# Patient Record
Sex: Male | Born: 1975 | Race: White | Hispanic: No | Marital: Married | Smoking: Current every day smoker
Health system: Southern US, Community
[De-identification: ages and names within clinical notes are randomized; demographics above are authoritative.]

## PROBLEM LIST (undated history)

## (undated) DIAGNOSIS — T7840XA Allergy, unspecified, initial encounter: Secondary | ICD-10-CM

## (undated) DIAGNOSIS — K219 Gastro-esophageal reflux disease without esophagitis: Secondary | ICD-10-CM

## (undated) DIAGNOSIS — N200 Calculus of kidney: Secondary | ICD-10-CM

## (undated) DIAGNOSIS — R55 Syncope and collapse: Secondary | ICD-10-CM

## (undated) DIAGNOSIS — M5136 Other intervertebral disc degeneration, lumbar region: Secondary | ICD-10-CM

## (undated) DIAGNOSIS — M5126 Other intervertebral disc displacement, lumbar region: Secondary | ICD-10-CM

## (undated) DIAGNOSIS — R51 Headache: Secondary | ICD-10-CM

## (undated) DIAGNOSIS — J45909 Unspecified asthma, uncomplicated: Secondary | ICD-10-CM

## (undated) DIAGNOSIS — R569 Unspecified convulsions: Secondary | ICD-10-CM

## (undated) DIAGNOSIS — F419 Anxiety disorder, unspecified: Secondary | ICD-10-CM

## (undated) DIAGNOSIS — M51369 Other intervertebral disc degeneration, lumbar region without mention of lumbar back pain or lower extremity pain: Secondary | ICD-10-CM

## (undated) HISTORY — DX: Syncope and collapse: R55

## (undated) HISTORY — DX: Allergy, unspecified, initial encounter: T78.40XA

## (undated) HISTORY — DX: Headache: R51

## (undated) HISTORY — PX: TONSILLECTOMY: SUR1361

## (undated) HISTORY — PX: CHOLECYSTECTOMY: SHX55

## (undated) HISTORY — PX: APPENDECTOMY: SHX54

## (undated) HISTORY — PX: CERVICAL DISC ARTHROPLASTY: SHX587

## (undated) HISTORY — PX: HERNIA REPAIR: SHX51

## (undated) HISTORY — PX: CYSTOSCOPY W/ URETERAL STENT PLACEMENT: SHX1429

## (undated) HISTORY — DX: Unspecified convulsions: R56.9

## (undated) HISTORY — PX: EXTRACORPOREAL SHOCK WAVE LITHOTRIPSY: SHX1557

---

## 2004-03-05 ENCOUNTER — Emergency Department (HOSPITAL_COMMUNITY): Admission: EM | Admit: 2004-03-05 | Discharge: 2004-03-05 | Payer: Self-pay | Admitting: Emergency Medicine

## 2005-09-03 ENCOUNTER — Emergency Department (HOSPITAL_COMMUNITY): Admission: EM | Admit: 2005-09-03 | Discharge: 2005-09-03 | Payer: Self-pay | Admitting: Emergency Medicine

## 2005-11-11 ENCOUNTER — Emergency Department (HOSPITAL_COMMUNITY): Admission: EM | Admit: 2005-11-11 | Discharge: 2005-11-11 | Payer: Self-pay | Admitting: Emergency Medicine

## 2005-12-15 ENCOUNTER — Emergency Department (HOSPITAL_COMMUNITY): Admission: EM | Admit: 2005-12-15 | Discharge: 2005-12-15 | Payer: Self-pay | Admitting: Emergency Medicine

## 2006-01-07 ENCOUNTER — Emergency Department (HOSPITAL_COMMUNITY): Admission: EM | Admit: 2006-01-07 | Discharge: 2006-01-07 | Payer: Self-pay | Admitting: Emergency Medicine

## 2006-02-16 ENCOUNTER — Emergency Department (HOSPITAL_COMMUNITY): Admission: EM | Admit: 2006-02-16 | Discharge: 2006-02-16 | Payer: Self-pay | Admitting: *Deleted

## 2006-04-01 ENCOUNTER — Emergency Department (HOSPITAL_COMMUNITY): Admission: EM | Admit: 2006-04-01 | Discharge: 2006-04-01 | Payer: Self-pay | Admitting: Emergency Medicine

## 2006-04-13 ENCOUNTER — Emergency Department (HOSPITAL_COMMUNITY): Admission: EM | Admit: 2006-04-13 | Discharge: 2006-04-13 | Payer: Self-pay | Admitting: Emergency Medicine

## 2006-05-15 ENCOUNTER — Emergency Department (HOSPITAL_COMMUNITY): Admission: EM | Admit: 2006-05-15 | Discharge: 2006-05-15 | Payer: Self-pay | Admitting: Emergency Medicine

## 2006-05-26 ENCOUNTER — Emergency Department (HOSPITAL_COMMUNITY): Admission: EM | Admit: 2006-05-26 | Discharge: 2006-05-26 | Payer: Self-pay | Admitting: Emergency Medicine

## 2006-08-05 ENCOUNTER — Emergency Department (HOSPITAL_COMMUNITY): Admission: EM | Admit: 2006-08-05 | Discharge: 2006-08-05 | Payer: Self-pay | Admitting: Emergency Medicine

## 2006-08-17 ENCOUNTER — Emergency Department (HOSPITAL_COMMUNITY): Admission: EM | Admit: 2006-08-17 | Discharge: 2006-08-17 | Payer: Self-pay | Admitting: Emergency Medicine

## 2006-09-15 ENCOUNTER — Emergency Department (HOSPITAL_COMMUNITY): Admission: EM | Admit: 2006-09-15 | Discharge: 2006-09-15 | Payer: Self-pay | Admitting: Emergency Medicine

## 2006-11-30 ENCOUNTER — Emergency Department (HOSPITAL_COMMUNITY): Admission: EM | Admit: 2006-11-30 | Discharge: 2006-11-30 | Payer: Self-pay | Admitting: Emergency Medicine

## 2006-12-10 ENCOUNTER — Emergency Department (HOSPITAL_COMMUNITY): Admission: EM | Admit: 2006-12-10 | Discharge: 2006-12-10 | Payer: Self-pay | Admitting: Emergency Medicine

## 2007-03-02 ENCOUNTER — Emergency Department (HOSPITAL_COMMUNITY): Admission: EM | Admit: 2007-03-02 | Discharge: 2007-03-02 | Payer: Self-pay | Admitting: Emergency Medicine

## 2007-05-17 ENCOUNTER — Emergency Department (HOSPITAL_COMMUNITY): Admission: EM | Admit: 2007-05-17 | Discharge: 2007-05-17 | Payer: Self-pay | Admitting: Emergency Medicine

## 2007-08-11 ENCOUNTER — Emergency Department (HOSPITAL_COMMUNITY): Admission: EM | Admit: 2007-08-11 | Discharge: 2007-08-11 | Payer: Self-pay | Admitting: Emergency Medicine

## 2007-10-16 ENCOUNTER — Emergency Department (HOSPITAL_COMMUNITY): Admission: EM | Admit: 2007-10-16 | Discharge: 2007-10-16 | Payer: Self-pay | Admitting: Emergency Medicine

## 2007-11-22 ENCOUNTER — Emergency Department (HOSPITAL_COMMUNITY): Admission: EM | Admit: 2007-11-22 | Discharge: 2007-11-22 | Payer: Self-pay | Admitting: Neurosurgery

## 2008-02-28 ENCOUNTER — Ambulatory Visit (HOSPITAL_COMMUNITY): Admission: RE | Admit: 2008-02-28 | Discharge: 2008-02-28 | Payer: Self-pay | Admitting: Family Medicine

## 2008-03-23 ENCOUNTER — Emergency Department (HOSPITAL_COMMUNITY): Admission: EM | Admit: 2008-03-23 | Discharge: 2008-03-23 | Payer: Self-pay | Admitting: Emergency Medicine

## 2008-08-17 ENCOUNTER — Emergency Department (HOSPITAL_COMMUNITY): Admission: EM | Admit: 2008-08-17 | Discharge: 2008-08-17 | Payer: Self-pay | Admitting: Emergency Medicine

## 2008-09-02 ENCOUNTER — Emergency Department (HOSPITAL_COMMUNITY): Admission: EM | Admit: 2008-09-02 | Discharge: 2008-09-03 | Payer: Self-pay | Admitting: Emergency Medicine

## 2008-09-04 ENCOUNTER — Observation Stay (HOSPITAL_COMMUNITY): Admission: EM | Admit: 2008-09-04 | Discharge: 2008-09-04 | Payer: Self-pay | Admitting: Emergency Medicine

## 2008-09-09 ENCOUNTER — Inpatient Hospital Stay (HOSPITAL_COMMUNITY): Admission: EM | Admit: 2008-09-09 | Discharge: 2008-09-12 | Payer: Self-pay | Admitting: Emergency Medicine

## 2009-01-17 ENCOUNTER — Emergency Department (HOSPITAL_COMMUNITY): Admission: EM | Admit: 2009-01-17 | Discharge: 2009-01-17 | Payer: Self-pay | Admitting: Emergency Medicine

## 2009-01-17 ENCOUNTER — Emergency Department (HOSPITAL_COMMUNITY): Admission: EM | Admit: 2009-01-17 | Discharge: 2009-01-17 | Payer: Self-pay | Admitting: Family Medicine

## 2009-07-18 ENCOUNTER — Emergency Department (HOSPITAL_COMMUNITY): Admission: EM | Admit: 2009-07-18 | Discharge: 2009-07-18 | Payer: Self-pay | Admitting: Family Medicine

## 2009-10-16 ENCOUNTER — Emergency Department (HOSPITAL_COMMUNITY): Admission: EM | Admit: 2009-10-16 | Discharge: 2009-10-16 | Payer: Self-pay | Admitting: Family Medicine

## 2010-07-08 ENCOUNTER — Encounter: Payer: Self-pay | Admitting: Family Medicine

## 2010-08-03 ENCOUNTER — Inpatient Hospital Stay (INDEPENDENT_AMBULATORY_CARE_PROVIDER_SITE_OTHER)
Admission: RE | Admit: 2010-08-03 | Discharge: 2010-08-03 | Disposition: A | Discharge status: Home / Self Care | Payer: Self-pay | Source: Ambulatory Visit | Attending: Family Medicine | Admitting: Family Medicine

## 2010-08-03 DIAGNOSIS — J45909 Unspecified asthma, uncomplicated: Secondary | ICD-10-CM

## 2010-08-03 DIAGNOSIS — J019 Acute sinusitis, unspecified: Secondary | ICD-10-CM

## 2010-08-26 ENCOUNTER — Inpatient Hospital Stay (INDEPENDENT_AMBULATORY_CARE_PROVIDER_SITE_OTHER)
Admission: RE | Admit: 2010-08-26 | Discharge: 2010-08-26 | Disposition: A | Discharge status: Home / Self Care | Payer: Self-pay | Source: Ambulatory Visit

## 2010-08-26 DIAGNOSIS — B9789 Other viral agents as the cause of diseases classified elsewhere: Secondary | ICD-10-CM

## 2010-09-16 ENCOUNTER — Emergency Department (HOSPITAL_COMMUNITY): Payer: Self-pay

## 2010-09-16 ENCOUNTER — Emergency Department (HOSPITAL_COMMUNITY)
Admission: EM | Admit: 2010-09-16 | Discharge: 2010-09-16 | Disposition: A | Discharge status: Home / Self Care | Payer: Self-pay | Attending: Emergency Medicine | Admitting: Emergency Medicine

## 2010-09-16 DIAGNOSIS — J45909 Unspecified asthma, uncomplicated: Secondary | ICD-10-CM | POA: Insufficient documentation

## 2010-09-16 DIAGNOSIS — M79609 Pain in unspecified limb: Secondary | ICD-10-CM | POA: Insufficient documentation

## 2010-09-16 DIAGNOSIS — R1032 Left lower quadrant pain: Secondary | ICD-10-CM | POA: Insufficient documentation

## 2010-09-16 DIAGNOSIS — R209 Unspecified disturbances of skin sensation: Secondary | ICD-10-CM | POA: Insufficient documentation

## 2010-09-16 DIAGNOSIS — Z9889 Other specified postprocedural states: Secondary | ICD-10-CM | POA: Insufficient documentation

## 2010-09-16 DIAGNOSIS — R110 Nausea: Secondary | ICD-10-CM | POA: Insufficient documentation

## 2010-09-16 DIAGNOSIS — R10814 Left lower quadrant abdominal tenderness: Secondary | ICD-10-CM | POA: Insufficient documentation

## 2010-09-16 LAB — URINALYSIS, ROUTINE W REFLEX MICROSCOPIC
Bilirubin Urine: NEGATIVE
Glucose, UA: NEGATIVE mg/dL
Hgb urine dipstick: NEGATIVE
Nitrite: NEGATIVE
Specific Gravity, Urine: 1.029 (ref 1.005–1.030)
Urobilinogen, UA: 0.2 mg/dL (ref 0.0–1.0)

## 2010-09-16 LAB — DIFFERENTIAL
Basophils Absolute: 0 10*3/uL (ref 0.0–0.1)
Basophils Relative: 0 % (ref 0–1)
Eosinophils Absolute: 0.2 10*3/uL (ref 0.0–0.7)
Monocytes Absolute: 0.6 10*3/uL (ref 0.1–1.0)
Neutro Abs: 5.2 10*3/uL (ref 1.7–7.7)
Neutrophils Relative %: 50 % (ref 43–77)

## 2010-09-16 LAB — BASIC METABOLIC PANEL
CO2: 25 mEq/L (ref 19–32)
Calcium: 9.1 mg/dL (ref 8.4–10.5)
Creatinine, Ser: 1.02 mg/dL (ref 0.4–1.5)
GFR calc Af Amer: >60 mL/min (ref >60)
GFR calc non Af Amer: >60 mL/min (ref >60)
Sodium: 140 mEq/L (ref 135–145)

## 2010-09-16 LAB — CBC
Hemoglobin: 15.9 g/dL (ref 13.0–17.0)
MCHC: 34.5 g/dL (ref 30.0–36.0)
Platelets: 284 10*3/uL (ref 150–400)

## 2010-09-16 MED ORDER — IOHEXOL 300 MG/ML  SOLN
100.0000 mL | Freq: Once | INTRAMUSCULAR | Status: AC | PRN
  Administered 2010-09-16: 100 mL via INTRAVENOUS

## 2010-09-21 LAB — POCT CARDIAC MARKERS
CKMB, poc: <1 ng/mL — ABNORMAL LOW (ref 1.0–8.0)
Myoglobin, poc: 34.4 ng/mL (ref 12–200)
Troponin i, poc: <0.05 ng/mL (ref 0.00–0.09)

## 2010-09-21 LAB — POCT I-STAT, CHEM 8
Chloride: 106 mEq/L (ref 96–112)
Glucose, Bld: 87 mg/dL (ref 70–99)
HCT: 50 % (ref 39.0–52.0)
Hemoglobin: 17 g/dL (ref 13.0–17.0)
Potassium: 5 mEq/L (ref 3.5–5.1)
Sodium: 138 mEq/L (ref 135–145)

## 2010-09-26 LAB — POCT I-STAT, CHEM 8
BUN: 19 mg/dL (ref 6–23)
BUN: 24 mg/dL — ABNORMAL HIGH (ref 6–23)
BUN: 19 mg/dL (ref 6–23)
Calcium, Ion: 1.07 mmol/L — ABNORMAL LOW (ref 1.12–1.32)
Chloride: 108 mEq/L (ref 96–112)
Creatinine, Ser: 1.9 mg/dL — ABNORMAL HIGH (ref 0.4–1.5)
Creatinine, Ser: 2.3 mg/dL — ABNORMAL HIGH (ref 0.4–1.5)
Glucose, Bld: 103 mg/dL — ABNORMAL HIGH (ref 70–99)
Glucose, Bld: 106 mg/dL — ABNORMAL HIGH (ref 70–99)
Glucose, Bld: 85 mg/dL (ref 70–99)
HCT: 50 % (ref 39.0–52.0)
Hemoglobin: 16.7 g/dL (ref 13.0–17.0)
Potassium: 3.8 mEq/L (ref 3.5–5.1)
Sodium: 138 mEq/L (ref 135–145)
Sodium: 138 mEq/L (ref 135–145)
Sodium: 139 mEq/L (ref 135–145)
TCO2: 23 mmol/L (ref 0–100)
TCO2: 26 mmol/L (ref 0–100)
TCO2: 26 mmol/L (ref 0–100)
TCO2: 24 mmol/L (ref 0–100)

## 2010-09-26 LAB — URINALYSIS, ROUTINE W REFLEX MICROSCOPIC
Bilirubin Urine: NEGATIVE
Bilirubin Urine: NEGATIVE
Glucose, UA: NEGATIVE mg/dL
Ketones, ur: NEGATIVE mg/dL
Nitrite: NEGATIVE
Nitrite: NEGATIVE
Nitrite: NEGATIVE
Protein, ur: 30 mg/dL — AB
Specific Gravity, Urine: 1.013 (ref 1.005–1.030)
Specific Gravity, Urine: 1.035 — ABNORMAL HIGH (ref 1.005–1.030)
Specific Gravity, Urine: 1.035 — ABNORMAL HIGH (ref 1.005–1.030)
Urobilinogen, UA: 0.2 mg/dL (ref 0.0–1.0)
Urobilinogen, UA: 0.2 mg/dL (ref 0.0–1.0)
pH: 6 (ref 5.0–8.0)
pH: 6.5 (ref 5.0–8.0)

## 2010-09-26 LAB — URINE MICROSCOPIC-ADD ON

## 2010-09-26 LAB — DIFFERENTIAL
Basophils Absolute: 0 10*3/uL (ref 0.0–0.1)
Eosinophils Relative: 2 % (ref 0–5)
Lymphocytes Relative: 16 % (ref 12–46)
Lymphocytes Relative: 21 % (ref 12–46)
Lymphs Abs: 2.4 10*3/uL (ref 0.7–4.0)
Monocytes Relative: 7 % (ref 3–12)
Neutro Abs: 9.2 10*3/uL — ABNORMAL HIGH (ref 1.7–7.7)
Neutrophils Relative %: 73 % (ref 43–77)

## 2010-09-26 LAB — BASIC METABOLIC PANEL
BUN: 16 mg/dL (ref 6–23)
CO2: 29 mEq/L (ref 19–32)
Calcium: 8.7 mg/dL (ref 8.4–10.5)
Calcium: 9 mg/dL (ref 8.4–10.5)
Creatinine, Ser: 1.47 mg/dL (ref 0.4–1.5)
GFR calc Af Amer: >60 mL/min (ref >60)
GFR calc non Af Amer: 48 mL/min — ABNORMAL LOW (ref >60)
Glucose, Bld: 106 mg/dL — ABNORMAL HIGH (ref 70–99)
Glucose, Bld: 123 mg/dL — ABNORMAL HIGH (ref 70–99)

## 2010-09-26 LAB — CBC
HCT: 44.4 % (ref 39.0–52.0)
Hemoglobin: 13.3 g/dL (ref 13.0–17.0)
Hemoglobin: 15.4 g/dL (ref 13.0–17.0)
MCHC: 33.9 g/dL (ref 30.0–36.0)
MCV: 91.2 fL (ref 78.0–100.0)
MCV: 93.4 fL (ref 78.0–100.0)
Platelets: 282 10*3/uL (ref 150–400)
Platelets: 261 10*3/uL (ref 150–400)
RBC: 4.22 MIL/uL (ref 4.22–5.81)
RBC: 4.87 MIL/uL (ref 4.22–5.81)
RDW: 13.1 % (ref 11.5–15.5)
RDW: 13.3 % (ref 11.5–15.5)
WBC: 14.6 10*3/uL — ABNORMAL HIGH (ref 4.0–10.5)

## 2010-09-26 LAB — URINE CULTURE

## 2010-10-29 NOTE — H&P (Signed)
[name], [name] NO.:  [id number]   MEDICAL RECORD NO.:  [id number]          PATIENT TYPE:  OBV   LOCATION:  0101                         FACILITY:  [facility]   PHYSICIAN:  Heloise Purpura, MD      DATE OF BIRTH:  19-Jul-1975   DATE OF ADMISSION:  09/04/2008  DATE OF DISCHARGE:                              HISTORY & PHYSICAL   CHIEF COMPLAINT:  Left flank pain.   HISTORY:  [name] is a 35 year old with a history of nephrolithiasis that  has been refractory to shock wave lithotripsy in the past.  This  apparently is his fourth episode.  He previously has undergone shock  wave lithotripsy and necessitated ureteroscopic removal after failure  shock wave lithotripsy.  He has been told that he has calcium based  stones.  He began having some flank pain earlier this month which  intensified approximately 48 hours ago when he had severe left-sided  flank pain with radiation to his left lower quadrant.  He presented to  the emergency department 2 days ago and did undergo a stone CT which  demonstrated a 5 mm left UPJ calculus. His pain was able to be  controlled and he was discharged home on Percocet.  He returned today  with uncontrolled pain despite taking Percocet.  He has had some nausea  and one episode of vomiting but denies any fever.  He did have an  episode of gross hematuria earlier today but denies dysuria.   PAST MEDICAL HISTORY:  1. Peptic ulcer disease.  2. Nephrolithiasis.   PAST SURGICAL HISTORY:  1. Appendectomy.  2. Right inguinal hernia repair.  3. Shock wave lithotripsy  4. Ureteroscopic stone removal.   MEDICATIONS:  Tums.   ALLERGIES:  CODEINE which has resulted in hives and a question of  laryngeal edema.   FAMILY HISTORY:  He has an uncle with nephrolithiasis.   SOCIAL HISTORY:  Smokes 3/4 of a pack of cigarettes per day.  He will  drink alcohol but only rarely.  He works in [employer].   REVIEW OF SYSTEMS:  Complete review of systems was  performed.  All  systems are negative except as in the history of present illness.   PHYSICAL EXAMINATION:  VITAL SIGNS:  Temperature 98.1, pulse 68,  respirations 22, blood pressure 145/95.  CONSTITUTIONAL:  Alert and oriented in no acute distress.  HEENT: Normocephalic, atraumatic.  NECK:  Supple without lymphadenopathy.  CARDIOVASCULAR:  Regular rate and rhythm without obvious murmurs.  LUNGS:  Clear bilaterally.  ABDOMEN:  He has moderate left CVA tenderness with mild to moderate left  lower quadrant pain.  No abdominal masses.  GU: Normal male external genitalia.  No testicular pain or masses.  EXTREMITIES:  No edema.  SKIN:  No visible skin lesions.  NEUROLOGIC:  Grossly intact.   LABORATORY DATA:  Serum creatinine is 1.9.  Urinalysis demonstrates 11-  20 red blood cells with amorphous urate crystals.   Radiologic imaging, independently reviewed:  The patient's CT scan which  demonstrates a 5 mm left UPJ calculus with mild to moderate  hydronephrosis.  No other urinary calculi are identified.  I also  reviewed his KUB which demonstrates a radiopaque calculus.  This was  performed earlier today and is consistent with the location on CT scan  at the UPJ.   IMPRESSION:  Left ureteral calculus with uncontrolled pain.   PLAN:  He will be admitted for IV pain control.  He will strain his  urine and I will tentatively plan to proceed with ureteroscopic laser  lithotripsy later today.  We have discussed all options and based on the  fact the patient has been refractory to shock wave lithotripsy, this  would not appear to be as good of an option and the patient does agree.  We have discussed the potential risks, complications, alternative  treatment options, associated with our planned procedure and informed  consent has been obtained.      Heloise Purpura, MD  Electronically Signed     LB/MEDQ  D:  09/04/2008  T:  09/04/2008  Job:  [id number]

## 2010-10-29 NOTE — Op Note (Signed)
NAMESABIN, GIBEAULT NO.:  [id number]   MEDICAL RECORD NO.:  [id number]          PATIENT TYPE:  INP   LOCATION:  1426                         FACILITY:  [facility]   PHYSICIAN:  Heloise Purpura, MD      DATE OF BIRTH:  1975/12/01   DATE OF PROCEDURE:  09/11/2008  DATE OF DISCHARGE:                               OPERATIVE REPORT   PREOPERATIVE DIAGNOSIS:  Left ureteral calculus.   POSTOPERATIVE DIAGNOSIS:  Left ureteral calculus.   PROCEDURES:  1. Cystoscopy.  2. Left ureteroscopy with laser lithotripsy and removal of stone.  3. Left ureteral stent placement (6 x 26).   ANESTHESIA:  General.   COMPLICATIONS:  None.   INDICATION:  Hobart is a 35 year old gentleman who recently presented in  acute renal colic secondary to a 5-mm proximal left ureteral calculus.  He underwent an attempt at treatment due to continued severe pain on  September 04, 2008.  However, his ureter would not allow passage of the  ureteroscope and, therefore, he underwent ureteral stent placement to  allow for passive dilation of his ureter.  He has continued to have  significant and severe pain over the past week, requiring hospital  admission this weekend for pain control.  I have, therefore, discussed  options with him this morning including continued pain management versus  proceeding to the operating room for possible definitive treatment or at  least a stent change.  The patient did understand the potential risk  that we would not be able to definitively treat his stone based on the  fact that his stent had been in place for only one week.  Informed  consent was obtained after a full discussion of the potential risks,  benefits, and alternative options.   DESCRIPTION OF PROCEDURE:  The patient was taken to the operating room  and a general anesthetic was administered.  He was given preoperative  antibiotics, placed in the dorsal lithotomy position, and prepped and  draped in the usual  sterile fashion.  Next, a preoperative timeout was  performed.  Cystourethroscopy was performed which demonstrated a normal  anterior and posterior urethra.  On inspection of the bladder, the  patient was noted to have a left ureteral stent which was able to be  brought out to the urethral meatus with the aid of the flexible  graspers.  A 0.038 sensor guidewire was then advanced up through the  stent and past the ureteral calculus which again could be seen in a  stable position in the proximal ureter.  The wire was positioned in the  renal pelvis and the stent was removed.  A 12/14 ureteral access sheath  was then advanced over the wire and passed easily up into the proximal  ureter at a point approximately 2 cm below the stone.  The digital  ureteroscope was then advanced through the ureteral access sheath up to  the stone which was easily visualized.  With the aid of the 200-micron  holmium laser fiber, the stone was fragmented at settings of 0.6 Joules  and a frequency of 6  Hertz.  Once the stone was adequately fragmented,  all fragments were removed with the N-gage stone basket.  Reinspection  of the renal collecting system and proximal ureter revealed no further  stone fragments and no fragments could be seen on fluoroscopy.  A 0.038  sensor guidewire was then repositioned in the renal pelvis under direct  vision through the ureteroscope.  The ureteroscope was removed and the  ureteral access sheath was removed.  The wire was back-loaded over the  cystoscope and a 6 x 26 double-J ureteral stent was advanced over the  wire using Seldinger technique and positioned under fluoroscopic and  cystoscopic guidance.  The wire was then removed with a good curl noted  in the renal pelvis  as well as in the bladder.  A string tether was left in place.  The  patient's bladder was emptied and the procedure was ended.  He tolerated  the procedure well without complications.  His stone fragments were  sent  to [facility] for stone analysis.      Heloise Purpura, MD  Electronically Signed     LB/MEDQ  D:  09/11/2008  T:  09/11/2008  Job:  [id number]

## 2010-10-29 NOTE — Discharge Summary (Signed)
NAMEGIULIANO, PREECE NO.:  [id number]   MEDICAL RECORD NO.:  [id number]          PATIENT TYPE:  INP   LOCATION:  1426                         FACILITY:  [facility]   PHYSICIAN:  Heloise Purpura, MD      DATE OF BIRTH:  [name], [name]   DATE OF ADMISSION:  09/09/2008  DATE OF DISCHARGE:  09/12/2008                               DISCHARGE SUMMARY   ADMISSION DIAGNOSES:  1. Left ureteral calculus.  2. Left flank pain.  3. Acute renal insufficiency.   DISCHARGE DIAGNOSES:  1. Left ureteral calculus.  2. Left flank pain.  3. Acute renal insufficiency.   HISTORY AND PHYSICAL:  For full details, please see admission history  and physical.  Briefly, [name] is a 35 year old who was recently found to  have a left ureteral calculus.  He underwent left ureteral stent  placement and subsequently had continued severe left-sided flank pain  which caused him to return to the emergency department on September 09, 2008.  A CT scan at that time demonstrated the stent to be in  appropriate position.  He was admitted to the hospital for IV pain  control.  He continued to have severe pain on September 11, 2008 and  therefore was taken to the operating room to undergo stent change and  definitive stone treatment.  His stone was able to be managed  ureteroscopically and was removed.  An indwelling stent was left after  the procedure.  He was monitored that evening, and his pain subsequently  was able to be controlled with oral pain medication by the following  morning.  In addition, he was noted to have an elevated creatinine of  2.3 on admission to the hospital.  This subsequently decreased to 1.47.  It was felt that his acute renal insufficiency was likely related to  dehydration, as he does not have other risk factors for chronic kidney  disease.   DISPOSITION:  Home.   DISCHARGE MEDICATIONS:  He was instructed to resume his regular home  medications.  He was given a prescription to take  Percocet as needed for  pain and was given a prescription to begin Bactrim 1 tablet p.o. b.i.d.  for the next 5 days.  Of note, he was noted to have a rash in the  hospital, and this was felt possibly to be related to ciprofloxacin,  although the patient had been receiving ciprofloxacin for the prior 2  days without difficulty.  He had also received it at his prior hospital  admission without problems.   DISCHARGE INSTRUCTIONS:  He was instructed to resume diet and activity  as tolerated.   FOLLOW UP:  He will follow up in the next 2 weeks for further evaluation  and to recheck his renal function.      Heloise Purpura, MD  Electronically Signed     LB/MEDQ  D:  09/12/2008  T:  09/12/2008  Job:  [id number]

## 2010-10-29 NOTE — Op Note (Signed)
NAMEBLEU, MINERD NO.:  [id number]   MEDICAL RECORD NO.:  [id number]          PATIENT TYPE:  OBV   LOCATION:  1332                         FACILITY:  [facility]   PHYSICIAN:  Heloise Purpura, MD      DATE OF BIRTH:  April 16, 1976   DATE OF PROCEDURE:  09/04/2008  DATE OF DISCHARGE:                               OPERATIVE REPORT   PREOPERATIVE DIAGNOSIS:  Left ureteral calculus.   POSTOPERATIVE DIAGNOSIS:  Left ureteral calculus.   PROCEDURE:  1. Cystoscopy.  2. Left retrograde pyelography.  3. Left ureteroscopy with balloon dilation of left ureter.  4. Left ureteral stent placement (6 x 26).   SURGEON:  Dr. Heloise Purpura.   ANESTHESIA:  General.   COMPLICATIONS:  None.   INDICATIONS:  Jayceion is a 35 year old gentleman who presented to the  emergency department multiple times this weekend with complaints of left  sided flank pain.  He was found to have a left UPJ calculus with  uncontrolled pain.  He was admitted to the hospital for IV pain control  and after a discussion regarding management options for treatment,  elected to undergo surgical intervention with ureteroscopy and possible  laser lithotripsy.  The potential risks, complications, and alternative  treatment options were discussed in detail and informed consent was  obtained.   DESCRIPTION OF PROCEDURE:  The patient was taken to the operating room  and a general anesthetic was administered.  He was given preoperative  antibiotics, placed in the dorsal lithotomy position, and prepped and  draped in the usual sterile fashion.  Next, a preoperative timeout was  performed.  Cystourethroscopy was then performed which demonstrated a  normal anterior and posterior urethra.  Examination of the bladder  revealed no evidence of any tumors, stones, or other mucosal pathology.  The left ureteral orifice was identified and an attempt was made to  cannulate it with a 6-French ureteral catheter.  The caliber of  the  ureteral orifice, however, was noted to be too small to allow the  ureteral catheter to be passed into it.  Therefore, a 0.038 Sensor  guidewire was advanced through the ureteral catheter into the left  ureter under fluoroscopic guidance.  The ureteral catheter was then able  to be advanced over the wire into the left ureter.  Omnipaque contrast  was then injected which did demonstrate a filling defect in the proximal  ureter consistent with the patient's known stone location from his prior  imaging studies.  The wire was then able to be passed up into the left  renal pelvis under fluoroscopic guidance.  A 12/14 ureteral access  sheath was then attempted to be passed over the wire to dilate the left  ureter and provide access to the proximal ureter.  However, the left  ureteral orifice would not allow the ureteral access sheath to be passed  easily.  Therefore, the cystoscope was placed over the wire and under  direct vision, the 4-cm ureteral balloon dilator was used to dilate the  distal ureter.  The balloon was left inflated for approximately 3  minutes.  The balloon was then deflated and the balloon dilator was  removed.  An attempt to again place the ureteral access sheath was  unsuccessful due to the still tight space in the distal ureter.  A semi-  rigid ureteroscope was then advanced next to the wire and into distal  ureter.  The distal ureter was noted to be tight; however, the  ureteroscope could be advanced approximately two-thirds up the way of  the ureter.  No calculi were seen in the distal ureter.  At this point,  one more attempt was made to pass the ureteral access sheath and this  was again unsuccessful.  It was felt that placement of the flexible  ureteroscope next to a guidewire would likely be unsuccessful as well.  Therefore, it was decided to proceed with stent placement and passive  ureteral dilation at this point.  The wire was back loaded over the   cystoscope sheath and a 6 x 26 double J ureteral stent was advanced over  the wire using Seldinger technique.  It was appropriately positioned  under fluoroscopic and cystoscopic guidance.  The wire was then removed  with a good curl noted in the renal pelvis, as well as in the bladder.  The patient's bladder was emptied and the procedure was ended.  He  tolerated the procedure well and without complications.  It was decided  to allow for passive dilation of the ureter with a subsequent  ureteroscopic procedure for definitive stone management.      Heloise Purpura, MD  Electronically Signed     LB/MEDQ  D:  09/04/2008  T:  09/04/2008  Job:  [id number]

## 2010-10-29 NOTE — H&P (Signed)
[name], [name] NO.:  [id number]   MEDICAL RECORD NO.:  [id number]          PATIENT TYPE:  INP   LOCATION:  1426                         FACILITY:  [facility]   PHYSICIAN:  Maretta Bees. Vonita Moss, M.D.DATE OF BIRTH:  1976/01/14   DATE OF ADMISSION:  09/09/2008  DATE OF DISCHARGE:                              HISTORY & PHYSICAL   This 35 year old gentleman has a long history of stones and, on September 04, 2008, he underwent cystoscopy and dilation of a left ureteral  stricture and placement of a left double-J catheter by Dr. Laverle Patter.  Dr.  Laverle Patter was unable to reach the stone at the time of that ureteroscopy so  a double-J was left up with the idea that he would go back in a couple  weeks to manipulate the stone.  The patient went home and, despite  Percocet, was having increased left flank pain, left testicular pain  today.  He is having some bloody urine with clots but no fever.   PAST MEDICAL HISTORY:  Unchanged.   PAST SURGICAL HISTORY:  Unchanged from last visit.   MEDICATIONS:  Percocet p.r.n. pain.   ALLERGIES:  CODEINE caused hives and MORPHINE may make him agitated.   He has family history of stones.   He does smoke some cigarettes.  Rare alcohol ingestion.   REVIEW OF SYSTEMS:  Unchanged from previous visit.   PHYSICAL EXAMINATION:  VITAL SIGNS:  His blood pressure was 133/89,  respiratory rate was 24, pulse is 83, temperature is 97.7.  GENERAL:  He was alert and oriented but in obvious distress but  improved, according to the patient.  NECK:  Supple.  LUNGS:  No respiratory distress.  ABDOMEN: Slight guarding in the left flank.   IMPRESSION:  1. Increasing left flank pain, possibly due to clot obstruction      despite the stent.  2. Left ureteral stone.  3. History of stones.   PLAN:  To admit for IV fluids and hydration.  Urine was cultured.  Dilaudid PCA will be used for pain relief and he will be given Flomax.   IMPRESSION:  A left flank  pain and the pain,      Maretta Bees. Vonita Moss, M.D.  Electronically Signed     LJP/MEDQ  D:  09/09/2008  T:  09/09/2008  Job:  [id number]

## 2010-11-28 ENCOUNTER — Emergency Department (HOSPITAL_COMMUNITY): Payer: Managed Care, Other (non HMO)

## 2010-11-28 ENCOUNTER — Emergency Department (HOSPITAL_COMMUNITY)
Admission: EM | Admit: 2010-11-28 | Discharge: 2010-11-29 | Disposition: A | Discharge status: Home / Self Care | Payer: Managed Care, Other (non HMO) | Attending: Emergency Medicine | Admitting: Emergency Medicine

## 2010-11-28 DIAGNOSIS — W219XXA Striking against or struck by unspecified sports equipment, initial encounter: Secondary | ICD-10-CM | POA: Insufficient documentation

## 2010-11-28 DIAGNOSIS — Y9367 Activity, basketball: Secondary | ICD-10-CM | POA: Insufficient documentation

## 2010-11-28 DIAGNOSIS — J45909 Unspecified asthma, uncomplicated: Secondary | ICD-10-CM | POA: Insufficient documentation

## 2010-11-28 DIAGNOSIS — S6390XA Sprain of unspecified part of unspecified wrist and hand, initial encounter: Secondary | ICD-10-CM | POA: Insufficient documentation

## 2010-11-28 DIAGNOSIS — M79609 Pain in unspecified limb: Secondary | ICD-10-CM | POA: Insufficient documentation

## 2011-03-07 LAB — INFLUENZA A AND B ANTIGEN (CONVERTED LAB)
Inflenza A Ag: NEGATIVE
Influenza B Ag: NEGATIVE

## 2011-03-24 ENCOUNTER — Inpatient Hospital Stay (INDEPENDENT_AMBULATORY_CARE_PROVIDER_SITE_OTHER)
Admission: RE | Admit: 2011-03-24 | Discharge: 2011-03-24 | Disposition: A | Discharge status: Home / Self Care | Payer: Managed Care, Other (non HMO) | Source: Ambulatory Visit | Attending: Family Medicine | Admitting: Family Medicine

## 2011-03-24 DIAGNOSIS — H109 Unspecified conjunctivitis: Secondary | ICD-10-CM

## 2011-03-27 LAB — POCT RAPID STREP A: Streptococcus, Group A Screen (Direct): NEGATIVE

## 2012-02-10 ENCOUNTER — Emergency Department (HOSPITAL_COMMUNITY): Payer: Self-pay

## 2012-02-10 ENCOUNTER — Encounter (HOSPITAL_COMMUNITY): Payer: Self-pay | Admitting: Emergency Medicine

## 2012-02-10 ENCOUNTER — Emergency Department (HOSPITAL_COMMUNITY)
Admission: EM | Admit: 2012-02-10 | Discharge: 2012-02-10 | Disposition: A | Discharge status: Home / Self Care | Payer: Self-pay | Attending: Emergency Medicine | Admitting: Emergency Medicine

## 2012-02-10 DIAGNOSIS — K219 Gastro-esophageal reflux disease without esophagitis: Secondary | ICD-10-CM | POA: Insufficient documentation

## 2012-02-10 DIAGNOSIS — J45909 Unspecified asthma, uncomplicated: Secondary | ICD-10-CM | POA: Insufficient documentation

## 2012-02-10 DIAGNOSIS — J40 Bronchitis, not specified as acute or chronic: Secondary | ICD-10-CM | POA: Insufficient documentation

## 2012-02-10 HISTORY — DX: Calculus of kidney: N20.0

## 2012-02-10 HISTORY — DX: Unspecified asthma, uncomplicated: J45.909

## 2012-02-10 MED ORDER — ALBUTEROL SULFATE (5 MG/ML) 0.5% IN NEBU
5.0000 mg | INHALATION_SOLUTION | Freq: Once | RESPIRATORY_TRACT | Status: AC
  Administered 2012-02-10: 5 mg via RESPIRATORY_TRACT
  Filled 2012-02-10: qty 40

## 2012-02-10 MED ORDER — PREDNISONE 20 MG PO TABS: 40.0000 mg | ORAL_TABLET | Freq: Every day | ORAL | Status: AC

## 2012-02-10 MED ORDER — PREDNISONE 20 MG PO TABS
60.0000 mg | ORAL_TABLET | Freq: Once | ORAL | Status: AC
  Administered 2012-02-10: 60 mg via ORAL
  Filled 2012-02-10: qty 1

## 2012-02-10 MED ORDER — BENZONATATE 100 MG PO CAPS: 100.0000 mg | ORAL_CAPSULE | Freq: Three times a day (TID) | ORAL | Status: AC | PRN

## 2012-02-10 NOTE — ED Notes (Signed)
Pt c/o sob with chest pain when deep breathing. Pt has had head/chest congestion x1 week with cough. OTC mucinex has helped with s/s. Sob had increased this am and pt took albuterol inhaler with no relief. Wife reports pt having fever but has not taken with thermometer.

## 2012-02-10 NOTE — ED Provider Notes (Signed)
I saw and evaluated the patient, reviewed the resident's note and I agree with the findings and plan.   Cheri Guppy, MD 02/10/12 [phone]

## 2012-02-10 NOTE — ED Notes (Signed)
Discharged home with written and verbal instructions.  No questions or concerns at discharge. 

## 2012-02-10 NOTE — ED Provider Notes (Signed)
History     CSN: [id number]  Arrival date & time 02/10/12  6213   First MD Initiated Contact with Patient 02/10/12 [phone]      Chief Complaint  Patient presents with  . Shortness of Breath    (Consider location/radiation/quality/duration/timing/severity/associated sxs/prior treatment) Patient is a 36 y.o. male presenting with cough. The history is provided by the patient.  Cough This is a new problem. Episode onset: 1 week. The problem occurs every few minutes. The problem has been gradually worsening. The cough is productive of sputum. The maximum temperature recorded prior to his arrival was 100 to 100.9 F. The fever has been present for less than 1 day. Associated symptoms include sore throat, shortness of breath and wheezing. Pertinent negatives include no chest pain, no chills and no headaches. He has tried decongestants (albuterol inhaler) for the symptoms. The treatment provided mild relief. He is not a smoker. His past medical history is significant for asthma.    Past Medical History  Diagnosis Date  . Asthma   . Kidney stones, mixed calcium oxalate     Past Surgical History  Procedure Date  . Cystoscopy w/ ureteral stent placement   . Extracorporeal shock wave lithotripsy   . Hernia repair   . Cervical disc arthroplasty   . Tonsillectomy   . Appendectomy     History reviewed. No pertinent family history.  History  Substance Use Topics  . Smoking status: Never Smoker   . Smokeless tobacco: Not on file  . Alcohol Use: Yes     social      Review of Systems  Constitutional: Positive for fever. Negative for chills.  HENT: Positive for sore throat.   Eyes: Negative.   Respiratory: Positive for cough, chest tightness, shortness of breath and wheezing.   Cardiovascular: Negative for chest pain, palpitations and leg swelling.  Gastrointestinal: Negative for nausea, vomiting, abdominal pain, diarrhea and constipation.  Genitourinary: Negative.   Musculoskeletal:  Negative.   Skin: Negative.   Neurological: Negative.  Negative for headaches.  All other systems reviewed and are negative.    Allergies  Review of patient's allergies indicates no known allergies.  Home Medications   Current Outpatient Rx  Name Route Sig Dispense Refill  . ALBUTEROL SULFATE HFA 108 (90 BASE) MCG/ACT IN AERS Inhalation Inhale 2 puffs into the lungs every 4 (four) hours as needed. For shortness of breath    . GUAIFENESIN ER 600 MG PO TB12 Oral Take 1,200 mg by mouth 2 (two) times daily.    Marland Kitchen PANTOPRAZOLE SODIUM 40 MG PO TBEC Oral Take 40 mg by mouth daily.    Marland Kitchen BENZONATATE 100 MG PO CAPS Oral Take 1 capsule (100 mg total) by mouth 3 (three) times daily as needed for cough. 21 capsule 0  . PREDNISONE 20 MG PO TABS Oral Take 2 tablets (40 mg total) by mouth daily. 8 tablet 0    BP 128/82  Pulse 72  Temp 98.3 F (36.8 C) (Oral)  Resp 18  Ht 6\' 1"  (1.854 m)  Wt 250 lb (113.399 kg)  BMI 32.98 kg/m2  Physical Exam  Nursing note and vitals reviewed. Constitutional: He is oriented to person, place, and time. He appears well-developed and well-nourished. No distress.  HENT:  Head: Normocephalic and atraumatic.  Mouth/Throat: Oropharynx is clear and moist and mucous membranes are normal.  Eyes: Conjunctivae are normal.  Neck: Neck supple.  Cardiovascular: Normal rate, regular rhythm, normal heart sounds and intact distal pulses.   Pulmonary/Chest:  Effort normal. No respiratory distress. He has wheezes in the right upper field and the left upper field. He has rhonchi in the right lower field and the left lower field.  Abdominal: Soft. He exhibits no distension. There is no tenderness.  Musculoskeletal: Normal range of motion. He exhibits no edema and no tenderness.  Neurological: He is alert and oriented to person, place, and time.  Skin: Skin is warm and dry.    ED Course  Procedures (including critical care time)  Labs Reviewed - No data to display Dg Chest 2  View  02/10/2012  *RADIOLOGY REPORT*  Clinical Data: Shortness of breath, chest pain, productive cough, congestion, history asthma  CHEST - 2 VIEW  Comparison: 01/17/2009  Findings: Enlargement of cardiac silhouette. Mediastinal contours and pulmonary vascularity normal. Low lung volumes without infiltrate or effusion. No pneumothorax.  IMPRESSION: Enlargement of cardiac silhouette. Low lung volumes. No acute abnormalities.   Original Report Authenticated By: Lollie Marrow, M.D.      1. Bronchitis       MDM  36 yo male with PMHx of asthma and GERD who presents with one week of worsening productive cough and shortness of breath.  Pt reports associated sx of fever and chest tightness with coughing.  Sx acutely worsening for past 2 days.  AF, VSS, NAD.  No signs of respiratory distress.  End expiratory wheezing present bilateral upper fields.  Coarse rhonchi heard in bilateral lower fields.  Will get CXR and give albuterol treatment and prednisone.  CXR w/o evidence of pneumonia.  Presentation consistent with bronchitis.  Will DC home with course of prednisone and Tessalon.  Return precautions provided.        Cherre Robins, MD 02/10/12 [phone]

## 2012-04-28 ENCOUNTER — Ambulatory Visit (HOSPITAL_COMMUNITY): Payer: Managed Care, Other (non HMO)

## 2012-04-28 ENCOUNTER — Emergency Department (HOSPITAL_COMMUNITY)
Admission: EM | Admit: 2012-04-28 | Discharge: 2012-04-28 | Discharge status: Against Medical Advice | Payer: Managed Care, Other (non HMO) | Attending: Emergency Medicine | Admitting: Emergency Medicine

## 2012-04-28 ENCOUNTER — Encounter (HOSPITAL_COMMUNITY): Payer: Self-pay | Admitting: Emergency Medicine

## 2012-04-28 DIAGNOSIS — R509 Fever, unspecified: Secondary | ICD-10-CM | POA: Insufficient documentation

## 2012-04-28 DIAGNOSIS — J029 Acute pharyngitis, unspecified: Secondary | ICD-10-CM | POA: Insufficient documentation

## 2012-04-28 DIAGNOSIS — Z87442 Personal history of urinary calculi: Secondary | ICD-10-CM | POA: Insufficient documentation

## 2012-04-28 DIAGNOSIS — R059 Cough, unspecified: Secondary | ICD-10-CM | POA: Insufficient documentation

## 2012-04-28 DIAGNOSIS — R0602 Shortness of breath: Secondary | ICD-10-CM | POA: Insufficient documentation

## 2012-04-28 DIAGNOSIS — K219 Gastro-esophageal reflux disease without esophagitis: Secondary | ICD-10-CM | POA: Insufficient documentation

## 2012-04-28 DIAGNOSIS — F17200 Nicotine dependence, unspecified, uncomplicated: Secondary | ICD-10-CM | POA: Insufficient documentation

## 2012-04-28 DIAGNOSIS — R51 Headache: Secondary | ICD-10-CM | POA: Insufficient documentation

## 2012-04-28 DIAGNOSIS — J45909 Unspecified asthma, uncomplicated: Secondary | ICD-10-CM | POA: Insufficient documentation

## 2012-04-28 DIAGNOSIS — R05 Cough: Secondary | ICD-10-CM

## 2012-04-28 DIAGNOSIS — R52 Pain, unspecified: Secondary | ICD-10-CM | POA: Insufficient documentation

## 2012-04-28 NOTE — ED Provider Notes (Signed)
History  This chart was scribed for Doug Sou, MD by Shari Heritage, ED Scribe. The patient was seen in room TR06C/TR06C. Patient's care was started at 1537.   CSN: [id number]  Arrival date & time 04/28/12  1455   First MD Initiated Contact with Patient 04/28/12 1537      Chief Complaint  Patient presents with  . Influenza   The history is provided by the patient. No language interpreter was used.    HPI Comments: [name] is a 36 y.o. male who presents to the Emergency Department complaining of flu-like symptoms including mild to moderate SOB, moderate cough, fever, moderate generalized body aches, mild non-radiating facial headache, mild sore throat, pain with swallowing and congestion onset 2 days ago. Patient states that his maximum fever at home was 101. Patient has taken over-the-counter Ibuprofen with no relief. Patient has a medical history of asthma, kidney stones and acid reflux. Patient's surgical history includes appendectomy and hernia repair. Patient has smoked for the past 10 years and smokes 1 pack over 2-3 days.    Past Medical History  Diagnosis Date  . Asthma   . Kidney stones, mixed calcium oxalate     Past Surgical History  Procedure Date  . Cystoscopy w/ ureteral stent placement   . Extracorporeal shock wave lithotripsy   . Hernia repair   . Cervical disc arthroplasty   . Tonsillectomy   . Appendectomy     History reviewed. No pertinent family history.  History  Substance Use Topics  . Smoking status: Never Smoker   . Smokeless tobacco: Not on file  . Alcohol Use: Yes     Comment: social      Review of Systems  Constitutional: Positive for fever.  HENT: Positive for congestion and sore throat.   Respiratory: Positive for cough and shortness of breath.   Cardiovascular: Negative.   Gastrointestinal: Negative.   Musculoskeletal: Negative.   Skin: Negative.   Neurological: Positive for headaches.  Hematological: Negative.     Psychiatric/Behavioral: Negative.     Allergies  Review of patient's allergies indicates no known allergies.  Home Medications   Current Outpatient Rx  Name  Route  Sig  Dispense  Refill  . ALBUTEROL SULFATE HFA 108 (90 BASE) MCG/ACT IN AERS   Inhalation   Inhale 2 puffs into the lungs every 4 (four) hours as needed. For shortness of breath         . GUAIFENESIN ER 600 MG PO TB12   Oral   Take 1,200 mg by mouth 2 (two) times daily.         Marland Kitchen PANTOPRAZOLE SODIUM 40 MG PO TBEC   Oral   Take 40 mg by mouth daily.           Triage Vitals: BP 128/90  Pulse 73  Temp 98.2 F (36.8 C) (Oral)  Resp 18  SpO2 99%  Physical Exam  Nursing note and vitals reviewed. Constitutional: He is oriented to person, place, and time. He appears well-developed and well-nourished.  HENT:  Head: Normocephalic and atraumatic.  Eyes: EOM are normal. Pupils are equal, round, and reactive to light.  Neck: Neck supple. No tracheal deviation present.  Cardiovascular: Normal rate and regular rhythm.   No murmur heard. Pulmonary/Chest: Effort normal. He has wheezes (minimal expiratory wheezes posteriorly).  Abdominal: Soft. He exhibits no distension.  Musculoskeletal: Normal range of motion. He exhibits no edema.  Neurological: He is alert and oriented to person, place, and time. No  sensory deficit.  Skin: Skin is warm and dry.  Psychiatric: He has a normal mood and affect. His behavior is normal.    ED Course  Procedures (including critical care time) DIAGNOSTIC STUDIES: Oxygen Saturation is 99% on room air, normal by my interpretation.    COORDINATION OF CARE: 3:47 PM- Patient informed of current plan for treatment and evaluation and agrees with plan at this time.   No diagnosis found.  Patient left the emergency department without notifying staff   MDM  Diagnosis cough          Doug Sou, MD 04/28/12 1721

## 2012-04-28 NOTE — ED Notes (Signed)
Pt c/o flu like sx x 2 days with body aches and fever

## 2012-04-28 NOTE — ED Notes (Signed)
Pt saw walking toward discharge. Pt placed gown on the bed and is not in the room

## 2012-04-28 NOTE — ED Notes (Signed)
Pt noted to be exiting unit, pt has not returned to room, no belongings remain in room at this time.

## 2012-05-17 ENCOUNTER — Encounter (HOSPITAL_COMMUNITY): Payer: Self-pay | Admitting: Emergency Medicine

## 2012-05-17 ENCOUNTER — Emergency Department (HOSPITAL_COMMUNITY)
Admission: EM | Admit: 2012-05-17 | Discharge: 2012-05-17 | Discharge status: Against Medical Advice | Payer: Self-pay | Attending: Emergency Medicine | Admitting: Emergency Medicine

## 2012-05-17 DIAGNOSIS — J3489 Other specified disorders of nose and nasal sinuses: Secondary | ICD-10-CM | POA: Insufficient documentation

## 2012-05-17 NOTE — ED Notes (Signed)
Called x2 not in waiting room.

## 2012-05-17 NOTE — ED Notes (Signed)
Pt states he has had congestion for approx. 3 wks with cough. Tried OTC meds with no relief as well as albuterol inhaler. Rib and back pain from coughing.

## 2012-05-18 ENCOUNTER — Emergency Department (HOSPITAL_COMMUNITY): Payer: Self-pay

## 2012-05-18 ENCOUNTER — Encounter (HOSPITAL_COMMUNITY): Payer: Self-pay | Admitting: Emergency Medicine

## 2012-05-18 ENCOUNTER — Observation Stay (HOSPITAL_COMMUNITY)
Admission: EM | Admit: 2012-05-18 | Discharge: 2012-05-18 | Disposition: A | Discharge status: Home / Self Care | Payer: Self-pay | Attending: Emergency Medicine | Admitting: Emergency Medicine

## 2012-05-18 DIAGNOSIS — Z79899 Other long term (current) drug therapy: Secondary | ICD-10-CM | POA: Insufficient documentation

## 2012-05-18 DIAGNOSIS — J069 Acute upper respiratory infection, unspecified: Principal | ICD-10-CM | POA: Insufficient documentation

## 2012-05-18 DIAGNOSIS — R059 Cough, unspecified: Secondary | ICD-10-CM | POA: Insufficient documentation

## 2012-05-18 DIAGNOSIS — J45909 Unspecified asthma, uncomplicated: Secondary | ICD-10-CM | POA: Insufficient documentation

## 2012-05-18 DIAGNOSIS — K219 Gastro-esophageal reflux disease without esophagitis: Secondary | ICD-10-CM | POA: Insufficient documentation

## 2012-05-18 DIAGNOSIS — Z87442 Personal history of urinary calculi: Secondary | ICD-10-CM | POA: Insufficient documentation

## 2012-05-18 DIAGNOSIS — F17200 Nicotine dependence, unspecified, uncomplicated: Secondary | ICD-10-CM | POA: Insufficient documentation

## 2012-05-18 DIAGNOSIS — R05 Cough: Secondary | ICD-10-CM | POA: Insufficient documentation

## 2012-05-18 DIAGNOSIS — Z8739 Personal history of other diseases of the musculoskeletal system and connective tissue: Secondary | ICD-10-CM | POA: Insufficient documentation

## 2012-05-18 HISTORY — DX: Other intervertebral disc displacement, lumbar region: M51.26

## 2012-05-18 HISTORY — DX: Other intervertebral disc degeneration, lumbar region: M51.36

## 2012-05-18 HISTORY — DX: Other intervertebral disc degeneration, lumbar region without mention of lumbar back pain or lower extremity pain: M51.369

## 2012-05-18 HISTORY — DX: Gastro-esophageal reflux disease without esophagitis: K21.9

## 2012-05-18 MED ORDER — PREDNISONE 20 MG PO TABS
60.0000 mg | ORAL_TABLET | Freq: Once | ORAL | Status: AC
  Administered 2012-05-18: 60 mg via ORAL
  Filled 2012-05-18: qty 3

## 2012-05-18 MED ORDER — IPRATROPIUM BROMIDE 0.02 % IN SOLN
0.5000 mg | Freq: Once | RESPIRATORY_TRACT | Status: AC
  Administered 2012-05-18: 0.5 mg via RESPIRATORY_TRACT
  Filled 2012-05-18: qty 2.5

## 2012-05-18 MED ORDER — AZITHROMYCIN 250 MG PO TABS: 250.0000 mg | ORAL_TABLET | Freq: Every day | ORAL | Status: DC

## 2012-05-18 MED ORDER — ALBUTEROL SULFATE (5 MG/ML) 0.5% IN NEBU
INHALATION_SOLUTION | RESPIRATORY_TRACT | Status: AC
  Administered 2012-05-18: 2.5 mg
  Filled 2012-05-18: qty 0.5

## 2012-05-18 MED ORDER — ALBUTEROL SULFATE (5 MG/ML) 0.5% IN NEBU
5.0000 mg | INHALATION_SOLUTION | Freq: Once | RESPIRATORY_TRACT | Status: AC
  Administered 2012-05-18: 5 mg via RESPIRATORY_TRACT
  Filled 2012-05-18: qty 1

## 2012-05-18 MED ORDER — PREDNISONE 20 MG PO TABS: 60.0000 mg | ORAL_TABLET | Freq: Every day | ORAL | Status: DC

## 2012-05-18 MED ORDER — ALBUTEROL SULFATE HFA 108 (90 BASE) MCG/ACT IN AERS: 2.0000 | INHALATION_SPRAY | RESPIRATORY_TRACT | Status: DC | PRN

## 2012-05-18 MED ORDER — PREDNISONE 20 MG PO TABS: 40.0000 mg | ORAL_TABLET | Freq: Every day | ORAL | Status: DC

## 2012-05-18 NOTE — Progress Notes (Signed)
Utilization review completed.  P.J. Blakeley Scheier,RN,BSN Case Manager [phone]  

## 2012-05-18 NOTE — ED Notes (Signed)
Pt brought back to room; pt getting undressed and into a gown; blanket given

## 2012-05-18 NOTE — ED Provider Notes (Signed)
History     CSN: [id number]  Arrival date & time 05/18/12  0818   First MD Initiated Contact with Patient 05/18/12 [phone]      Chief Complaint  Patient presents with  . Shortness of Breath  . Cough    (Consider location/radiation/quality/duration/timing/severity/associated sxs/prior treatment) HPI Pt presents with 2 weeks of cough and shortness of breath.  He states he feels that his breathing is tight.  He has hx of asthma- as a child.  No fever.  Cough is productive of brownish sputum intermittently.  Sore throat and nasal congestion were present at the beginning of illness, have now resolved.  No chest pain, no vomiting.  Has continued to drink liquids well.  Pt is a smoker.  There are no other associated systemic symptoms, there are no other alleviating or modifying factors.   Past Medical History  Diagnosis Date  . Asthma   . Kidney stones, mixed calcium oxalate   . GERD (gastroesophageal reflux disease)   . Bulging lumbar disc     Past Surgical History  Procedure Date  . Cystoscopy w/ ureteral stent placement   . Extracorporeal shock wave lithotripsy   . Hernia repair   . Cervical disc arthroplasty   . Tonsillectomy   . Appendectomy     No family history on file.  History  Substance Use Topics  . Smoking status: Current Every Day Smoker -- 0.5 packs/day    Types: Cigarettes  . Smokeless tobacco: Not on file  . Alcohol Use: Yes     Comment: social      Review of Systems ROS reviewed and all otherwise negative except for mentioned in HPI  Allergies  Review of patient's allergies indicates no known allergies.  Home Medications   Current Outpatient Rx  Name  Route  Sig  Dispense  Refill  . ALBUTEROL SULFATE HFA 108 (90 BASE) MCG/ACT IN AERS   Inhalation   Inhale 2 puffs into the lungs every 4 (four) hours as needed. For shortness of breath         . GUAIFENESIN ER 600 MG PO TB12   Oral   Take 600 mg by mouth 2 (two) times daily.         .  GUAIFENESIN 100 MG/5ML PO SOLN   Oral   Take 10 mLs by mouth every 6 (six) hours as needed. For cough         . PANTOPRAZOLE SODIUM 40 MG PO TBEC   Oral   Take 40 mg by mouth daily.         . ALBUTEROL SULFATE HFA 108 (90 BASE) MCG/ACT IN AERS   Inhalation   Inhale 2 puffs into the lungs every 4 (four) hours as needed for wheezing.   1 Inhaler   0   . AZITHROMYCIN 250 MG PO TABS   Oral   Take 1 tablet (250 mg total) by mouth daily. 500mg  PO day 1, then 250mg  PO days 205   6 tablet   0   . PREDNISONE 20 MG PO TABS   Oral   Take 2 tablets (40 mg total) by mouth daily.   10 tablet   0     BP 117/74  Pulse 60  Temp 98.6 F (37 C) (Oral)  Resp 18  Ht 6\' 1"  (1.854 m)  Wt 232 lb (105.235 kg)  BMI 30.61 kg/m2  SpO2 100% Vitals reviewed Physical Exam Physical Examination: General appearance - alert, well appearing, and in no distress  Mental status - alert, oriented to person, place, and time Eyes - no conjunctival injections, no scleral icterus Mouth - mucous membranes moist, pharynx normal without lesions Chest - symmetric air entry, bilateral wheezing, no increased respiratory effort, moderate air movement Heart - normal rate, regular rhythm, normal S1, S2, no murmurs, rubs, clicks or gallops Abdomen - soft, nontender, nondistended, no masses or organomegaly Extremities - peripheral pulses normal, no pedal edema, no clubbing or cyanosis Skin - normal coloration and turgor, no rashes  ED Course  Procedures (including critical care time)  9:25 AM pt to be moved to the CDU for asthma protocol.    Labs Reviewed - No data to display Dg Chest 2 View  05/18/2012  *RADIOLOGY REPORT*  Clinical Data: Shortness of breath, cough, congestion.  CHEST - 2 VIEW  Comparison: 02/10/2012  Findings: Heart is upper limits normal in size.  Lungs are clear. No effusions.  No acute bony abnormality.  IMPRESSION: No acute cardiopulmonary disease.   Original Report Authenticated By: Charlett Nose, M.D.      1. URI, acute       MDM  Pt presenting with 2 weeks of cough, shortness of breath.  Signficant wheezing and coughing on exam.  Xray without signs of pneumonia- images reviewed by me as well.  Pt started on albuterol/atrovent, given prednisone- will move to CDU placed under obs protocol for bronchospasm.         Ethelda Chick, MD 05/18/12 [phone]

## 2012-05-18 NOTE — ED Notes (Signed)
Patient states he was here approximately 2 wks ago and was told he had flu/possible pneumonia then.  Patient claims that he continues to worsen daily.  Patient claims chest and ribs hurt when he coughs.  Patient claims productive cough that is brown in color and white in color at times.   Patient claims that he is unable to take deep breaths.

## 2012-05-18 NOTE — Discharge Instructions (Signed)
Stay well hydrated. Use nasal saline to rinse the naris. Please establish primary care using a resource guide listed below. Return to the emergency room for significantly worsening symptoms.

## 2012-05-18 NOTE — ED Notes (Signed)
Pt. Is getting breathin treatment notemp.

## 2012-05-18 NOTE — ED Notes (Signed)
Patient is resting comfortably. 

## 2012-05-18 NOTE — ED Notes (Signed)
Family at bedside. 

## 2012-05-18 NOTE — ED Notes (Signed)
Respiratory called to come treat patient.

## 2012-05-18 NOTE — ED Notes (Signed)
Care transferred and report given to Becky, RN 

## 2012-05-18 NOTE — ED Provider Notes (Signed)
TAL KEMPKER is a 36 y.o. male transferred to CDU from Pod A. Signout from Dr. Karma Ganja as follows: Patient with history of childhood asthma presenting with 2 weeks of pleuritic chest pain, cough and shortness of breath.  Patient seen and examined at the bedside he states that he has a shortness of breath it is much worse in the morning runny nose and cough that is nonproductive. Patient's lung sounds are clear to auscultation with mild scattered wheezes he has upper airway coarse sounds, heart is regular rate and rhythm with no murmurs rubs or gallops abdominal exam is benign with no tenderness to palpation.  Pt seen and examined at the bedside he is feeling significantly improved after 2 treatments. His lung sounds are clear to auscultation I believe the wheezing to be coming from the upper airway. Heart is regular rate and rhythm with no murmurs rubs or gallops, abdominal exam is benign with no tenderness to palpation.   Patient refusing a third treatment states he is significantly improved.  Results for orders placed during the hospital encounter of 09/16/10  URINALYSIS, ROUTINE W REFLEX MICROSCOPIC      Component Value Range   Color, Urine YELLOW  YELLOW   APPearance CLEAR  CLEAR   Specific Gravity, Urine 1.029  1.005 - 1.030   pH 6.0  5.0 - 8.0   Glucose, UA NEGATIVE  NEGATIVE mg/dL   Hgb urine dipstick NEGATIVE  NEGATIVE   Bilirubin Urine NEGATIVE  NEGATIVE   Ketones, ur NEGATIVE  NEGATIVE mg/dL   Protein, ur NEGATIVE  NEGATIVE mg/dL   Urobilinogen, UA 0.2  0.0 - 1.0 mg/dL   Nitrite NEGATIVE  NEGATIVE   Leukocytes, UA    NEGATIVE   Value: NEGATIVE MICROSCOPIC NOT DONE ON URINES WITH NEGATIVE PROTEIN, BLOOD, LEUKOCYTES, NITRITE, OR GLUCOSE <1000 mg/dL.  DIFFERENTIAL      Component Value Range   Neutrophils Relative 50  43 - 77 %   Neutro Abs 5.2  1.7 - 7.7 K/uL   Lymphocytes Relative 41  12 - 46 %   Lymphs Abs 4.3 (*) 0.7 - 4.0 K/uL   Monocytes Relative 6  3 - 12 %   Monocytes  Absolute 0.6  0.1 - 1.0 K/uL   Eosinophils Relative 2  0 - 5 %   Eosinophils Absolute 0.2  0.0 - 0.7 K/uL   Basophils Relative 0  0 - 1 %   Basophils Absolute 0.0  0.0 - 0.1 K/uL  CBC      Component Value Range   WBC 10.5  4.0 - 10.5 K/uL   RBC 5.07  4.22 - 5.81 MIL/uL   Hemoglobin 15.9  13.0 - 17.0 g/dL   HCT 16.1  09.6 - 04.5 %   MCV 90.9  78.0 - 100.0 fL   MCH 31.4  26.0 - 34.0 pg   MCHC 34.5  30.0 - 36.0 g/dL   RDW 40.9  81.1 - 91.4 %   Platelets 284  150 - 400 K/uL  BASIC METABOLIC PANEL      Component Value Range   Sodium 140  135 - 145 mEq/L   Potassium 4.0  3.5 - 5.1 mEq/L   Chloride 107  96 - 112 mEq/L   CO2 25  19 - 32 mEq/L   Glucose, Bld 101 (*) 70 - 99 mg/dL   BUN 16  6 - 23 mg/dL   Creatinine, Ser 7.82  0.4 - 1.5 mg/dL   Calcium 9.1  8.4 - 95.6  mg/dL   GFR calc non Af Amer >60  >60 mL/min   GFR calc Af Amer    >60 mL/min   Value: >60            The eGFR has been calculated     using the MDRD equation.     This calculation has not been     validated in all clinical     situations.     eGFR's persistently     <60 mL/min signify     possible Chronic Kidney Disease.   Dg Chest 2 View  05/18/2012  *RADIOLOGY REPORT*  Clinical Data: Shortness of breath, cough, congestion.  CHEST - 2 VIEW  Comparison: 02/10/2012  Findings: Heart is upper limits normal in size.  Lungs are clear. No effusions.  No acute bony abnormality.  IMPRESSION: No acute cardiopulmonary disease.   Original Report Authenticated By: Charlett Nose, M.D.       Pt verbalized understanding and agrees with care plan. Outpatient follow-up and return precautions given.    New Prescriptions   ALBUTEROL (PROVENTIL HFA;VENTOLIN HFA) 108 (90 BASE) MCG/ACT INHALER    Inhale 2 puffs into the lungs every 4 (four) hours as needed for wheezing.   AZITHROMYCIN (ZITHROMAX Z-PAK) 250 MG TABLET    Take 1 tablet (250 mg total) by mouth daily. 500mg  PO day 1, then 250mg  PO days 205   PREDNISONE (DELTASONE) 20 MG  TABLET    Take 2 tablets (40 mg total) by mouth daily.       Wynetta Emery, PA-C 05/18/12 1550

## 2012-05-19 NOTE — ED Provider Notes (Signed)
Medical screening examination/treatment/procedure(s) were conducted as a shared visit with non-physician practitioner(s) and myself.  I personally evaluated the patient during the encounter  I saw this patient primarily  Ethelda Chick, MD 05/19/12 1002

## 2012-08-12 ENCOUNTER — Encounter (HOSPITAL_COMMUNITY): Payer: Self-pay | Admitting: Emergency Medicine

## 2012-08-12 ENCOUNTER — Emergency Department (HOSPITAL_COMMUNITY)
Admission: EM | Admit: 2012-08-12 | Discharge: 2012-08-12 | Disposition: A | Discharge status: Home / Self Care | Payer: Self-pay | Attending: Emergency Medicine | Admitting: Emergency Medicine

## 2012-08-12 DIAGNOSIS — Z87442 Personal history of urinary calculi: Secondary | ICD-10-CM | POA: Insufficient documentation

## 2012-08-12 DIAGNOSIS — R197 Diarrhea, unspecified: Secondary | ICD-10-CM | POA: Insufficient documentation

## 2012-08-12 DIAGNOSIS — F17200 Nicotine dependence, unspecified, uncomplicated: Secondary | ICD-10-CM | POA: Insufficient documentation

## 2012-08-12 DIAGNOSIS — Z8739 Personal history of other diseases of the musculoskeletal system and connective tissue: Secondary | ICD-10-CM | POA: Insufficient documentation

## 2012-08-12 DIAGNOSIS — R1013 Epigastric pain: Secondary | ICD-10-CM | POA: Insufficient documentation

## 2012-08-12 DIAGNOSIS — Z79899 Other long term (current) drug therapy: Secondary | ICD-10-CM | POA: Insufficient documentation

## 2012-08-12 DIAGNOSIS — IMO0002 Reserved for concepts with insufficient information to code with codable children: Secondary | ICD-10-CM | POA: Insufficient documentation

## 2012-08-12 DIAGNOSIS — R112 Nausea with vomiting, unspecified: Secondary | ICD-10-CM | POA: Insufficient documentation

## 2012-08-12 DIAGNOSIS — J45909 Unspecified asthma, uncomplicated: Secondary | ICD-10-CM | POA: Insufficient documentation

## 2012-08-12 LAB — URINALYSIS, ROUTINE W REFLEX MICROSCOPIC
Glucose, UA: NEGATIVE mg/dL
Ketones, ur: NEGATIVE mg/dL
Leukocytes, UA: NEGATIVE
Protein, ur: NEGATIVE mg/dL

## 2012-08-12 LAB — CBC WITH DIFFERENTIAL/PLATELET
HCT: 48.8 % (ref 39.0–52.0)
Hemoglobin: 17.6 g/dL — ABNORMAL HIGH (ref 13.0–17.0)
Lymphocytes Relative: 21 % (ref 12–46)
Lymphs Abs: 2.3 10*3/uL (ref 0.7–4.0)
Monocytes Absolute: 0.9 10*3/uL (ref 0.1–1.0)
Monocytes Relative: 8 % (ref 3–12)
Neutro Abs: 7.7 10*3/uL (ref 1.7–7.7)
RBC: 5.47 MIL/uL (ref 4.22–5.81)
WBC: 11.1 10*3/uL — ABNORMAL HIGH (ref 4.0–10.5)

## 2012-08-12 LAB — COMPREHENSIVE METABOLIC PANEL
AST: 16 U/L (ref 0–37)
Alkaline Phosphatase: 81 U/L (ref 39–117)
BUN: 22 mg/dL (ref 6–23)
CO2: 25 mEq/L (ref 19–32)
Chloride: 99 mEq/L (ref 96–112)
Creatinine, Ser: 1.22 mg/dL (ref 0.50–1.35)
GFR calc non Af Amer: 75 mL/min — ABNORMAL LOW (ref >90)
Total Bilirubin: 0.3 mg/dL (ref 0.3–1.2)

## 2012-08-12 LAB — LIPASE, BLOOD: Lipase: 45 U/L (ref 11–59)

## 2012-08-12 MED ORDER — ONDANSETRON HCL 4 MG/2ML IJ SOLN
4.0000 mg | Freq: Once | INTRAMUSCULAR | Status: AC
  Administered 2012-08-12: 4 mg via INTRAVENOUS
  Filled 2012-08-12: qty 2

## 2012-08-12 MED ORDER — ONDANSETRON 8 MG PO TBDP: 8.0000 mg | ORAL_TABLET | Freq: Three times a day (TID) | ORAL | Status: DC | PRN

## 2012-08-12 MED ORDER — MORPHINE SULFATE 4 MG/ML IJ SOLN
4.0000 mg | Freq: Once | INTRAMUSCULAR | Status: AC
  Administered 2012-08-12: 4 mg via INTRAVENOUS
  Filled 2012-08-12: qty 1

## 2012-08-12 MED ORDER — FAMOTIDINE 20 MG PO TABS
40.0000 mg | ORAL_TABLET | Freq: Once | ORAL | Status: AC
  Administered 2012-08-12: 40 mg via ORAL
  Filled 2012-08-12: qty 2

## 2012-08-12 MED ORDER — SODIUM CHLORIDE 0.9 % IV BOLUS (SEPSIS)
1000.0000 mL | Freq: Once | INTRAVENOUS | Status: AC
  Administered 2012-08-12: 1000 mL via INTRAVENOUS

## 2012-08-12 MED ORDER — ONDANSETRON HCL 4 MG/2ML IJ SOLN
4.0000 mg | Freq: Once | INTRAMUSCULAR | Status: AC
Start: 2012-08-12 — End: 2012-08-12
  Administered 2012-08-12: 4 mg via INTRAVENOUS
  Filled 2012-08-12: qty 2

## 2012-08-12 MED ORDER — FAMOTIDINE 20 MG PO TABS: 20.0000 mg | ORAL_TABLET | Freq: Two times a day (BID) | ORAL | Status: DC

## 2012-08-12 NOTE — ED Provider Notes (Signed)
History     CSN: [id number]  Arrival date & time 08/12/12  1403   First MD Initiated Contact with Patient 08/12/12 1421      Chief Complaint  Patient presents with  . Emesis  . Abdominal Pain    (Consider location/radiation/quality/duration/timing/severity/associated sxs/prior treatment) HPI Comments: Patient history of appendectomy, kidney stone -- presents with complaint of persistent nausea, vomiting, and diarrhea for the past 48 hours. Symptoms have not been improving and have been constant. Patient states he's been having nonbloody, nonbilious vomit. He's been having nonbloody diarrhea. He has mild upper abdominal pain. Patient states that he took leftover Phenergan was unable to keep it in his stomach. Patient denies fever, cold symptoms, chest pain, cough, shortness of breath, urinary symptoms. Pain is different than his previous kidney stone pain. Patient denies heavy NSAID or alcohol use. Patient was clinically diagnosed with a peptic ulcer in the past. Onset of symptoms gradual. Course is constant. Nothing makes symptoms better or worse.  The history is provided by the patient.    Past Medical History  Diagnosis Date  . Asthma   . Kidney stones, mixed calcium oxalate   . GERD (gastroesophageal reflux disease)   . Bulging lumbar disc     Past Surgical History  Procedure Laterality Date  . Cystoscopy w/ ureteral stent placement    . Extracorporeal shock wave lithotripsy    . Hernia repair    . Cervical disc arthroplasty    . Tonsillectomy    . Appendectomy      History reviewed. No pertinent family history.  History  Substance Use Topics  . Smoking status: Current Every Day Smoker -- 0.50 packs/day    Types: Cigarettes  . Smokeless tobacco: Not on file  . Alcohol Use: Yes     Comment: social      Review of Systems  Constitutional: Negative for fever.  HENT: Negative for sore throat and rhinorrhea.   Eyes: Negative for redness.  Respiratory: Negative for  cough.   Cardiovascular: Negative for chest pain.  Gastrointestinal: Positive for nausea, vomiting, abdominal pain and diarrhea.  Genitourinary: Negative for dysuria.  Musculoskeletal: Negative for myalgias.  Skin: Negative for rash.  Neurological: Negative for headaches.    Allergies  Review of patient's allergies indicates no known allergies.  Home Medications   Current Outpatient Rx  Name  Route  Sig  Dispense  Refill  . albuterol (PROVENTIL HFA;VENTOLIN HFA) 108 (90 BASE) MCG/ACT inhaler   Inhalation   Inhale 2 puffs into the lungs every 4 (four) hours as needed. For shortness of breath         . albuterol (PROVENTIL HFA;VENTOLIN HFA) 108 (90 BASE) MCG/ACT inhaler   Inhalation   Inhale 2 puffs into the lungs every 4 (four) hours as needed for wheezing.   1 Inhaler   0   . azithromycin (ZITHROMAX Z-PAK) 250 MG tablet   Oral   Take 1 tablet (250 mg total) by mouth daily. 500mg  PO day 1, then 250mg  PO days 205   6 tablet   0   . guaiFENesin (MUCINEX) 600 MG 12 hr tablet   Oral   Take 600 mg by mouth 2 (two) times daily.         Marland Kitchen guaiFENesin (ROBITUSSIN) 100 MG/5ML SOLN   Oral   Take 10 mLs by mouth every 6 (six) hours as needed. For cough         . pantoprazole (PROTONIX) 40 MG tablet   Oral  Take 40 mg by mouth daily.         . predniSONE (DELTASONE) 20 MG tablet   Oral   Take 2 tablets (40 mg total) by mouth daily.   10 tablet   0     BP 134/96  Pulse 92  Temp(Src) 98.5 F (36.9 C) (Oral)  Resp 20  SpO2 95%  Physical Exam  Nursing note and vitals reviewed. Constitutional: He appears well-developed and well-nourished.  HENT:  Head: Normocephalic and atraumatic.  Eyes: Conjunctivae are normal. Right eye exhibits no discharge. Left eye exhibits no discharge.  Neck: Normal range of motion. Neck supple.  Cardiovascular: Normal rate, regular rhythm and normal heart sounds.   Pulmonary/Chest: Effort normal and breath sounds normal.  Abdominal:  Soft. There is tenderness in the epigastric area. There is no rigidity, no rebound, no guarding, no tenderness at McBurney's point and negative Murphy's sign.  Neurological: He is alert.  Skin: Skin is warm and dry.  Psychiatric: He has a normal mood and affect.    ED Course  Procedures (including critical care time)  Labs Reviewed  CBC WITH DIFFERENTIAL - Abnormal; Notable for the following:    WBC 11.1 (*)    Hemoglobin 17.6 (*)    MCHC 36.1 (*)    All other components within normal limits  COMPREHENSIVE METABOLIC PANEL - Abnormal; Notable for the following:    Potassium 3.4 (*)    GFR calc non Af Amer 75 (*)    GFR calc Af Amer 87 (*)    All other components within normal limits  URINALYSIS, ROUTINE W REFLEX MICROSCOPIC - Abnormal; Notable for the following:    Color, Urine AMBER (*)    Specific Gravity, Urine 1.031 (*)    Bilirubin Urine SMALL (*)    All other components within normal limits  LIPASE, BLOOD   No results found.   1. Nausea vomiting and diarrhea     2:32 PM Patient seen and examined. Work-up initiated. Medications ordered.   Vital signs reviewed and are as follows: Filed Vitals:   08/12/12 1419  BP: 134/96  Pulse: 92  Temp: 98.5 F (36.9 C)  Resp: 20   4:02 PM Patient starting 2nd liter NS. Nausea improved. Will give PO trial. Patient informed of results.   Handoff to [employer] who will re-eval after PO trial, additional fluids. If stable, can d/c to home with antiemetics, clear liquid diet with advancement to b.r.a.t. Diet.   The patient was urged to return to the Emergency Department immediately with worsening of current symptoms, worsening abdominal pain, persistent vomiting, blood noted in stools, fever, or any other concerns. The patient verbalized understanding.     MDM  Patient with symptoms consistent with viral gastroenteritis.  Vitals are stable, no fever.  No signs of dehydration, tolerating PO's.  Lungs are clear.  No focal abdominal  pain, no concern for appendicitis, cholecystitis, pancreatitis, ruptured viscus, UTI, kidney stone, or any other abdominal etiology.  Supportive therapy indicated with return if symptoms worsen.  Patient counseled.         [place], [place] 08/12/12 [phone]

## 2012-08-12 NOTE — ED Notes (Signed)
Pt c/o N/V/D x 2 days with abd pain and generalized weakness

## 2012-08-12 NOTE — ED Provider Notes (Signed)
Report received from [facility] [place], [place]. Patient presents with symptoms suggestive of gastroenteritis. Labs shows evidence of dehydration. Patient has been receiving IV fluid. He does have some mild epigastric abdominal tenderness, pain medication given. Patient otherwise appears stable.  BP 134/96  Pulse 92  Temp(Src) 98.5 F (36.9 C) (Oral)  Resp 20  SpO2 95%   On examination he appeared in no acute distress. Vital signs as documented. Skin warm and dry and without overt rashes. Neck without JVD. Lungs clear. Heart exam notable for regular rhythm, normal sounds and absence of murmurs, rubs or gallops. Abdomen unremarkable and without evidence of organomegaly, masses, or abdominal aortic enlargement. Extremities nonedematous.  5:32 PM Patient has received his IV fluid. Patient felt better. He is stable for discharge. Return precautions given.  BP 117/83  Pulse 80  Temp(Src) 98 F (36.7 C) (Oral)  Resp 16  SpO2 100%  I have reviewed nursing notes and vital signs. I personally reviewed the imaging tests through PACS system  I reviewed available ER/hospitalization records thought the EMR  Results for orders placed during the hospital encounter of 08/12/12  CBC WITH DIFFERENTIAL      Result Value Range   WBC 11.1 (*) 4.0 - 10.5 K/uL   RBC 5.47  4.22 - 5.81 MIL/uL   Hemoglobin 17.6 (*) 13.0 - 17.0 g/dL   HCT 40.9  81.1 - 91.4 %   MCV 89.2  78.0 - 100.0 fL   MCH 32.2  26.0 - 34.0 pg   MCHC 36.1 (*) 30.0 - 36.0 g/dL   RDW 78.2  95.6 - 21.3 %   Platelets 246  150 - 400 K/uL   Neutrophils Relative 70  43 - 77 %   Neutro Abs 7.7  1.7 - 7.7 K/uL   Lymphocytes Relative 21  12 - 46 %   Lymphs Abs 2.3  0.7 - 4.0 K/uL   Monocytes Relative 8  3 - 12 %   Monocytes Absolute 0.9  0.1 - 1.0 K/uL   Eosinophils Relative 2  0 - 5 %   Eosinophils Absolute 0.2  0.0 - 0.7 K/uL   Basophils Relative 0  0 - 1 %   Basophils Absolute 0.0  0.0 - 0.1 K/uL  COMPREHENSIVE METABOLIC PANEL      Result  Value Range   Sodium 136  135 - 145 mEq/L   Potassium 3.4 (*) 3.5 - 5.1 mEq/L   Chloride 99  96 - 112 mEq/L   CO2 25  19 - 32 mEq/L   Glucose, Bld 91  70 - 99 mg/dL   BUN 22  6 - 23 mg/dL   Creatinine, Ser 0.86  0.50 - 1.35 mg/dL   Calcium 9.2  8.4 - 57.8 mg/dL   Total Protein 7.4  6.0 - 8.3 g/dL   Albumin 3.9  3.5 - 5.2 g/dL   AST 16  0 - 37 U/L   ALT 22  0 - 53 U/L   Alkaline Phosphatase 81  39 - 117 U/L   Total Bilirubin 0.3  0.3 - 1.2 mg/dL   GFR calc non Af Amer 75 (*) >90 mL/min   GFR calc Af Amer 87 (*) >90 mL/min  LIPASE, BLOOD      Result Value Range   Lipase 45  11 - 59 U/L  URINALYSIS, ROUTINE W REFLEX MICROSCOPIC      Result Value Range   Color, Urine AMBER (*) YELLOW   APPearance CLEAR  CLEAR   Specific Gravity,  Urine 1.031 (*) 1.005 - 1.030   pH 5.5  5.0 - 8.0   Glucose, UA NEGATIVE  NEGATIVE mg/dL   Hgb urine dipstick NEGATIVE  NEGATIVE   Bilirubin Urine SMALL (*) NEGATIVE   Ketones, ur NEGATIVE  NEGATIVE mg/dL   Protein, ur NEGATIVE  NEGATIVE mg/dL   Urobilinogen, UA 0.2  0.0 - 1.0 mg/dL   Nitrite NEGATIVE  NEGATIVE   Leukocytes, UA NEGATIVE  NEGATIVE   No results found.    Fayrene Helper, PA-C 08/12/12 1733

## 2012-08-12 NOTE — ED Provider Notes (Signed)
Medical screening examination/treatment/procedure(s) were performed by non-physician practitioner and as supervising physician I was immediately available for consultation/collaboration.   Richardean Canal, MD 08/12/12 [phone]

## 2012-08-13 NOTE — ED Provider Notes (Signed)
Medical screening examination/treatment/procedure(s) were performed by non-physician practitioner and as supervising physician I was immediately available for consultation/collaboration.    Laiyah Exline D Chrisoula Zegarra, MD 08/13/12 0727 

## 2012-11-01 ENCOUNTER — Emergency Department (HOSPITAL_COMMUNITY): Payer: Self-pay

## 2012-11-01 ENCOUNTER — Emergency Department (HOSPITAL_COMMUNITY)
Admission: EM | Admit: 2012-11-01 | Discharge: 2012-11-01 | Disposition: A | Discharge status: Home / Self Care | Payer: Self-pay | Attending: Emergency Medicine | Admitting: Emergency Medicine

## 2012-11-01 ENCOUNTER — Encounter (HOSPITAL_COMMUNITY): Payer: Self-pay | Admitting: Emergency Medicine

## 2012-11-01 DIAGNOSIS — M545 Low back pain, unspecified: Secondary | ICD-10-CM | POA: Insufficient documentation

## 2012-11-01 DIAGNOSIS — R109 Unspecified abdominal pain: Secondary | ICD-10-CM | POA: Insufficient documentation

## 2012-11-01 DIAGNOSIS — R5381 Other malaise: Secondary | ICD-10-CM | POA: Insufficient documentation

## 2012-11-01 DIAGNOSIS — Z8739 Personal history of other diseases of the musculoskeletal system and connective tissue: Secondary | ICD-10-CM | POA: Insufficient documentation

## 2012-11-01 DIAGNOSIS — R350 Frequency of micturition: Secondary | ICD-10-CM | POA: Insufficient documentation

## 2012-11-01 DIAGNOSIS — F17200 Nicotine dependence, unspecified, uncomplicated: Secondary | ICD-10-CM | POA: Insufficient documentation

## 2012-11-01 DIAGNOSIS — Z9889 Other specified postprocedural states: Secondary | ICD-10-CM | POA: Insufficient documentation

## 2012-11-01 DIAGNOSIS — J45909 Unspecified asthma, uncomplicated: Secondary | ICD-10-CM | POA: Insufficient documentation

## 2012-11-01 DIAGNOSIS — Z87442 Personal history of urinary calculi: Secondary | ICD-10-CM | POA: Insufficient documentation

## 2012-11-01 DIAGNOSIS — K219 Gastro-esophageal reflux disease without esophagitis: Secondary | ICD-10-CM | POA: Insufficient documentation

## 2012-11-01 DIAGNOSIS — Z79899 Other long term (current) drug therapy: Secondary | ICD-10-CM | POA: Insufficient documentation

## 2012-11-01 DIAGNOSIS — R42 Dizziness and giddiness: Secondary | ICD-10-CM | POA: Insufficient documentation

## 2012-11-01 LAB — CBC WITH DIFFERENTIAL/PLATELET
Basophils Absolute: 0 10*3/uL (ref 0.0–0.1)
Basophils Relative: 0 % (ref 0–1)
Eosinophils Absolute: 0.1 10*3/uL (ref 0.0–0.7)
Eosinophils Relative: 1 % (ref 0–5)
HCT: 46.3 % (ref 39.0–52.0)
MCH: 31.8 pg (ref 26.0–34.0)
MCHC: 36.1 g/dL — ABNORMAL HIGH (ref 30.0–36.0)
Monocytes Absolute: 0.6 10*3/uL (ref 0.1–1.0)
Monocytes Relative: 5 % (ref 3–12)
Neutro Abs: 7.7 10*3/uL (ref 1.7–7.7)
RDW: 13.1 % (ref 11.5–15.5)

## 2012-11-01 LAB — COMPREHENSIVE METABOLIC PANEL
AST: 15 U/L (ref 0–37)
Albumin: 4 g/dL (ref 3.5–5.2)
BUN: 15 mg/dL (ref 6–23)
Calcium: 9.6 mg/dL (ref 8.4–10.5)
Chloride: 99 mEq/L (ref 96–112)
Creatinine, Ser: 1.11 mg/dL (ref 0.50–1.35)
Total Protein: 7.4 g/dL (ref 6.0–8.3)

## 2012-11-01 LAB — URINALYSIS, ROUTINE W REFLEX MICROSCOPIC
Glucose, UA: NEGATIVE mg/dL
Hgb urine dipstick: NEGATIVE
Protein, ur: NEGATIVE mg/dL
Specific Gravity, Urine: 1.016 (ref 1.005–1.030)
pH: 5.5 (ref 5.0–8.0)

## 2012-11-01 MED ORDER — KETOROLAC TROMETHAMINE 60 MG/2ML IM SOLN
60.0000 mg | Freq: Once | INTRAMUSCULAR | Status: AC
  Administered 2012-11-01: 60 mg via INTRAMUSCULAR
  Filled 2012-11-01: qty 2

## 2012-11-01 MED ORDER — METHOCARBAMOL 500 MG PO TABS
500.0000 mg | ORAL_TABLET | Freq: Once | ORAL | Status: AC
  Administered 2012-11-01: 500 mg via ORAL
  Filled 2012-11-01: qty 1

## 2012-11-01 MED ORDER — TRAMADOL HCL 50 MG PO TABS: 50.0000 mg | ORAL_TABLET | Freq: Four times a day (QID) | ORAL | Status: DC | PRN

## 2012-11-01 MED ORDER — METHOCARBAMOL 500 MG PO TABS: 500.0000 mg | ORAL_TABLET | Freq: Two times a day (BID) | ORAL | Status: DC

## 2012-11-01 NOTE — ED Provider Notes (Signed)
History    This chart was scribed for Loren Racer, MD by Leone Payor, ED Scribe. This patient was seen in room TR05C/TR05C and the patient's care was started 7:09 PM.   CSN: [id number]  Arrival date & time 11/01/12  1746   None     Chief Complaint  Patient presents with  . Flank Pain  . Abdominal Pain    The history is provided by the patient and the spouse. No language interpreter was used.    HPI Comments: [name] is a 37 y.o. male who presents to the Emergency Department complaining of constant, bilateral lower back pain and pressure starting today at 9:30am. Pt has h/o kidney stones but states this may feel the same. Reports never being able to pass a stone and has required lithotripsy. He describes the pain as sharp and jabbing. He has associated frequent urination, generalized fatigue. He denies doing any heavy lifting yesterday. He denies fever, chills, focal weakness/numbness. Pt has h/o kidney stones, bulging lumbar disc, cystoscopy with ureteral stent placement, hernia repair. Pt is a current everyday smoker and occasional alcohol user.    Past Medical History  Diagnosis Date  . Asthma   . Kidney stones, mixed calcium oxalate   . GERD (gastroesophageal reflux disease)   . Bulging lumbar disc     Past Surgical History  Procedure Laterality Date  . Cystoscopy w/ ureteral stent placement    . Extracorporeal shock wave lithotripsy    . Hernia repair    . Cervical disc arthroplasty    . Tonsillectomy    . Appendectomy      History reviewed. No pertinent family history.  History  Substance Use Topics  . Smoking status: Current Every Day Smoker -- 0.50 packs/day    Types: Cigarettes  . Smokeless tobacco: Not on file  . Alcohol Use: Yes     Comment: social      Review of Systems  Gastrointestinal: Positive for abdominal pain. Negative for vomiting.  Genitourinary: Positive for frequency and flank pain. Negative for hematuria.  Neurological: Positive  for dizziness, weakness and light-headedness.  All other systems reviewed and are negative.    Allergies  Review of patient's allergies indicates no known allergies.  Home Medications   Current Outpatient Rx  Name  Route  Sig  Dispense  Refill  . albuterol (PROVENTIL HFA;VENTOLIN HFA) 108 (90 BASE) MCG/ACT inhaler   Inhalation   Inhale 2 puffs into the lungs every 4 (four) hours as needed for shortness of breath. For shortness of breath         . ibuprofen (ADVIL,MOTRIN) 200 MG tablet   Oral   Take 600 mg by mouth every 6 (six) hours as needed for pain.         . pantoprazole (PROTONIX) 40 MG tablet   Oral   Take 40 mg by mouth daily.         . methocarbamol (ROBAXIN) 500 MG tablet   Oral   Take 1 tablet (500 mg total) by mouth 2 (two) times daily.   20 tablet   0   . traMADol (ULTRAM) 50 MG tablet   Oral   Take 1 tablet (50 mg total) by mouth every 6 (six) hours as needed for pain.   15 tablet   0     BP 128/86  Pulse 61  Temp(Src) 98.3 F (36.8 C) (Oral)  Resp 18  SpO2 97%  Physical Exam  Nursing note and vitals reviewed. Constitutional:  He is oriented to person, place, and time. He appears well-developed and well-nourished. No distress.  HENT:  Head: Normocephalic and atraumatic.  Eyes: EOM are normal.  Neck: Neck supple. No tracheal deviation present.  Cardiovascular: Normal rate, regular rhythm and normal heart sounds.   Pulmonary/Chest: Effort normal and breath sounds normal. No respiratory distress.  Abdominal: Soft. Bowel sounds are normal.  Musculoskeletal: Normal range of motion. He exhibits tenderness.  Tenderness to palpation to paraspinal lumbar muscles. No midline tenderness. No flank tenderness.   Neurological: He is alert and oriented to person, place, and time.  5/5 strength in all extremities. Sensation intact.   Skin: Skin is warm and dry.  Psychiatric: He has a normal mood and affect. His behavior is normal.    ED Course   Procedures (including critical care time)  DIAGNOSTIC STUDIES: Oxygen Saturation is 100% on room air, normal by my interpretation.    COORDINATION OF CARE: 7:25 PM Discussed treatment plan with pt at bedside and pt agreed to plan.   Labs Reviewed  CBC WITH DIFFERENTIAL - Abnormal; Notable for the following:    WBC 11.9 (*)    MCHC 36.1 (*)    All other components within normal limits  COMPREHENSIVE METABOLIC PANEL - Abnormal; Notable for the following:    Glucose, Bld 105 (*)    GFR calc non Af Amer 84 (*)    All other components within normal limits  URINALYSIS, ROUTINE W REFLEX MICROSCOPIC - Abnormal; Notable for the following:    Leukocytes, UA TRACE (*)    All other components within normal limits  URINE MICROSCOPIC-ADD ON   US Renal  11/01/2012   *RADIOLOGY REPORT*  Clinical Data: Bilateral flank and abdomen pain.  RENAL/URINARY TRACT ULTRASOUND COMPLETE  Comparison:  Abdomen and pelvis CT dated 09/16/2010.  Findings:  Right Kidney:  Normal, with fetal lobulations.  11.7 cm in length. No hydronephrosis.  Left Kidney:  2.2 cm simple appearing upper pole cyst.  11 mm calculus in the mid left kidney at the location of a previously demonstrated smaller calculus.  Otherwise, normal,, with fetal lobulations.  11.4 cm in length.  No hydronephrosis.  Bladder:  Minimal urine in the bladder due to voiding prior to the examination.  IMPRESSION:  1.  11 mm nonobstructing mid left renal calculus. 2.  2.2 cm simple upper pole left renal cyst. 3.  No hydronephrosis.   Original Report Authenticated By: Beckie Salts, M.D.     1. Low back pain       MDM  I personally performed the services described in this documentation, which was scribed in my presence. The recorded information has been reviewed and is accurate.  No red flags for back pain. No fever, IV drug use, focal weakness, saddle anesthesia, incontinence.    Loren Racer, MD 11/02/12 [phone]

## 2012-11-01 NOTE — ED Notes (Signed)
Pt c/o right sided flank pain and pressure; pt sts hx of kidney stone and does not feel same; pt sts urinary frequency

## 2012-11-01 NOTE — ED Notes (Signed)
Pt reports 4/10 bilateral flank pain described as pressure since AM. Reports hx of kidney stones, but states "I feel like I have more pressure then normal." Reports pain radiates forward with intermittent shooting pain. Reports frequency since AM, but denies dysuria/hematuria.

## 2012-11-30 ENCOUNTER — Emergency Department (HOSPITAL_COMMUNITY)
Admission: EM | Admit: 2012-11-30 | Discharge: 2012-11-30 | Disposition: A | Discharge status: Home / Self Care | Payer: Self-pay | Attending: Emergency Medicine | Admitting: Emergency Medicine

## 2012-11-30 ENCOUNTER — Emergency Department (HOSPITAL_COMMUNITY): Payer: Self-pay

## 2012-11-30 ENCOUNTER — Encounter (HOSPITAL_COMMUNITY): Payer: Self-pay | Admitting: Emergency Medicine

## 2012-11-30 DIAGNOSIS — F17200 Nicotine dependence, unspecified, uncomplicated: Secondary | ICD-10-CM | POA: Insufficient documentation

## 2012-11-30 DIAGNOSIS — K219 Gastro-esophageal reflux disease without esophagitis: Secondary | ICD-10-CM | POA: Insufficient documentation

## 2012-11-30 DIAGNOSIS — Z87442 Personal history of urinary calculi: Secondary | ICD-10-CM | POA: Insufficient documentation

## 2012-11-30 DIAGNOSIS — Z79899 Other long term (current) drug therapy: Secondary | ICD-10-CM | POA: Insufficient documentation

## 2012-11-30 DIAGNOSIS — Z8739 Personal history of other diseases of the musculoskeletal system and connective tissue: Secondary | ICD-10-CM | POA: Insufficient documentation

## 2012-11-30 DIAGNOSIS — M549 Dorsalgia, unspecified: Secondary | ICD-10-CM | POA: Insufficient documentation

## 2012-11-30 DIAGNOSIS — R112 Nausea with vomiting, unspecified: Secondary | ICD-10-CM | POA: Insufficient documentation

## 2012-11-30 DIAGNOSIS — J45909 Unspecified asthma, uncomplicated: Secondary | ICD-10-CM | POA: Insufficient documentation

## 2012-11-30 DIAGNOSIS — R109 Unspecified abdominal pain: Secondary | ICD-10-CM | POA: Insufficient documentation

## 2012-11-30 LAB — BASIC METABOLIC PANEL
BUN: 18 mg/dL (ref 6–23)
Calcium: 10 mg/dL (ref 8.4–10.5)
Creatinine, Ser: 1.2 mg/dL (ref 0.50–1.35)
GFR calc non Af Amer: 76 mL/min — ABNORMAL LOW (ref >90)
Glucose, Bld: 95 mg/dL (ref 70–99)

## 2012-11-30 LAB — CBC WITH DIFFERENTIAL/PLATELET
Eosinophils Absolute: 0.2 10*3/uL (ref 0.0–0.7)
Eosinophils Relative: 2 % (ref 0–5)
Hemoglobin: 16.5 g/dL (ref 13.0–17.0)
Lymphs Abs: 3.3 10*3/uL (ref 0.7–4.0)
MCH: 31 pg (ref 26.0–34.0)
MCHC: 34.9 g/dL (ref 30.0–36.0)
MCV: 88.7 fL (ref 78.0–100.0)
Monocytes Relative: 8 % (ref 3–12)
RBC: 5.33 MIL/uL (ref 4.22–5.81)

## 2012-11-30 LAB — URINALYSIS, ROUTINE W REFLEX MICROSCOPIC
Bilirubin Urine: NEGATIVE
Hgb urine dipstick: NEGATIVE
Ketones, ur: NEGATIVE mg/dL
Protein, ur: NEGATIVE mg/dL
Urobilinogen, UA: 0.2 mg/dL (ref 0.0–1.0)

## 2012-11-30 MED ORDER — ONDANSETRON HCL 4 MG/2ML IJ SOLN
4.0000 mg | Freq: Once | INTRAMUSCULAR | Status: AC
  Administered 2012-11-30: 4 mg via INTRAVENOUS
  Filled 2012-11-30: qty 2

## 2012-11-30 MED ORDER — MORPHINE SULFATE 4 MG/ML IJ SOLN
4.0000 mg | Freq: Once | INTRAMUSCULAR | Status: AC
  Administered 2012-11-30: 4 mg via INTRAVENOUS
  Filled 2012-11-30: qty 1

## 2012-11-30 MED ORDER — SODIUM CHLORIDE 0.9 % IV SOLN
INTRAVENOUS | Status: DC
  Administered 2012-11-30: 10:00:00 via INTRAVENOUS

## 2012-11-30 MED ORDER — HYDROCODONE-ACETAMINOPHEN 5-325 MG PO TABS: 2.0000 | ORAL_TABLET | ORAL | Status: DC | PRN

## 2012-11-30 NOTE — ED Provider Notes (Signed)
History     CSN: [id number]  Arrival date & time 11/30/12  0840   First MD Initiated Contact with Patient 11/30/12 [phone]      Chief Complaint  Patient presents with  . Flank Pain    (Consider location/radiation/quality/duration/timing/severity/associated sxs/prior treatment) HPI Comments: Patient presents to the ER for evaluation of left flank pain. Patient reports that the pain began yesterday. He had Vicodin left over from a previous kidney stone which he took, but it did not help. Patient reports pain severe at times, causing nausea and vomiting. Pain is now left back radiating around into the left groin. The patient does report a previous history of kidney stones. Pain is similar, severe like previous stones. Patient reports that he has never been able to pass his kidney stones, has required surgery.  Patient is a 37 y.o. male presenting with flank pain.  Flank Pain    Past Medical History  Diagnosis Date  . Asthma   . Kidney stones, mixed calcium oxalate   . GERD (gastroesophageal reflux disease)   . Bulging lumbar disc     Past Surgical History  Procedure Laterality Date  . Cystoscopy w/ ureteral stent placement    . Extracorporeal shock wave lithotripsy    . Hernia repair    . Cervical disc arthroplasty    . Tonsillectomy    . Appendectomy      No family history on file.  History  Substance Use Topics  . Smoking status: Current Every Day Smoker -- 0.50 packs/day    Types: Cigarettes  . Smokeless tobacco: Not on file  . Alcohol Use: Yes     Comment: social      Review of Systems  Gastrointestinal: Positive for nausea and vomiting.  Genitourinary: Positive for flank pain.  Musculoskeletal: Positive for back pain.  All other systems reviewed and are negative.    Allergies  Review of patient's allergies indicates no known allergies.  Home Medications   Current Outpatient Rx  Name  Route  Sig  Dispense  Refill  . albuterol (PROVENTIL HFA;VENTOLIN  HFA) 108 (90 BASE) MCG/ACT inhaler   Inhalation   Inhale 2 puffs into the lungs every 4 (four) hours as needed for shortness of breath. For shortness of breath         . ibuprofen (ADVIL,MOTRIN) 200 MG tablet   Oral   Take 600 mg by mouth every 6 (six) hours as needed for pain.         . pantoprazole (PROTONIX) 40 MG tablet   Oral   Take 40 mg by mouth daily.         . traMADol (ULTRAM) 50 MG tablet   Oral   Take 1 tablet (50 mg total) by mouth every 6 (six) hours as needed for pain.   15 tablet   0     There were no vitals taken for this visit.  Physical Exam  Constitutional: He is oriented to person, place, and time. He appears well-developed and well-nourished. He appears distressed.  HENT:  Head: Normocephalic and atraumatic.  Right Ear: Hearing normal.  Left Ear: Hearing normal.  Nose: Nose normal.  Mouth/Throat: Oropharynx is clear and moist and mucous membranes are normal.  Eyes: Conjunctivae and EOM are normal. Pupils are equal, round, and reactive to light.  Neck: Normal range of motion. Neck supple.  Cardiovascular: Regular rhythm, S1 normal and S2 normal.  Exam reveals no gallop and no friction rub.   No murmur heard. Pulmonary/Chest:  Effort normal and breath sounds normal. No respiratory distress. He exhibits no tenderness.  Abdominal: Soft. Normal appearance and bowel sounds are normal. There is no hepatosplenomegaly. There is no tenderness. There is no rebound, no guarding, no tenderness at McBurney's point and negative Murphy's sign. No hernia.  Musculoskeletal: Normal range of motion.  Neurological: He is alert and oriented to person, place, and time. He has normal strength. No cranial nerve deficit or sensory deficit. Coordination normal. GCS eye subscore is 4. GCS verbal subscore is 5. GCS motor subscore is 6.  Skin: Skin is warm, dry and intact. No rash noted. No cyanosis.  Psychiatric: He has a normal mood and affect. His speech is normal and behavior  is normal. Thought content normal.    ED Course  Procedures (including critical care time)  Labs Reviewed  BASIC METABOLIC PANEL - Abnormal; Notable for the following:    GFR calc non Af Amer 76 (*)    GFR calc Af Amer 89 (*)    All other components within normal limits  CBC WITH DIFFERENTIAL  URINALYSIS, ROUTINE W REFLEX MICROSCOPIC   US Renal  11/30/2012   *RADIOLOGY REPORT*  Clinical Data: Left flank pain.  RENAL/URINARY TRACT ULTRASOUND COMPLETE  Comparison:  11/01/2012.  Findings:  Right Kidney:  11.8 cm in length.  Normal renal cortical thickness and echogenicity.  No hydronephrosis.  Left Kidney:  11.9 cm in length.  Normal renal cortical thickness and echogenicity. There is a 2.2 x 2.1 x 2.0 cm upper pole cyst.  A 1 cm shadowing mid pole calculus is noted.  Bladder:  Normal.  IMPRESSION: Left renal cyst and renal calculus but no hydronephrosis.   Original Report Authenticated By: Rudie Meyer, M.D.     Diagnosis: Flank pain, unclear etiology    MDM  Patient comes to the ER for evaluation of left flank pain. Patient reports that he had a kidney stone visualized in the kidney a month ago and had a CAT scan. Patient had labs drawn because he reports a history of renal insufficiency, but kidney function is normal. Urinalysis was clear as well. Patient had ultrasound of kidneys to evaluate for possible kidney stone and hydronephrosis. He stone was seen in the midpole of the kidney, no hydronephrosis. Pain is of unclear etiology, but is not renal colic. His labs are unremarkable. He does have a history of chronic back pain. Patient has normal strength, sensation in the lower extremities. He'll be discharged, followup with his doctor. He has been seen at [facility] urology in the past, he was counseled to follow up in the office.        Gilda Crease, MD 11/30/12 1030

## 2012-11-30 NOTE — ED Notes (Signed)
Pt states he has L flank pain.  Hx of kidney stones.  Pt states feels like same.  Denies blood in urine.  States pain started yesterday am.

## 2012-11-30 NOTE — Progress Notes (Signed)
[facility] has seen patient. Patient stated that he was waiting to get insurance through his wife's job. Provided him with primary care resources.

## 2013-04-21 ENCOUNTER — Other Ambulatory Visit: Payer: Self-pay

## 2013-06-26 ENCOUNTER — Encounter (HOSPITAL_COMMUNITY): Payer: Self-pay | Admitting: Emergency Medicine

## 2013-06-26 DIAGNOSIS — M5126 Other intervertebral disc displacement, lumbar region: Secondary | ICD-10-CM | POA: Insufficient documentation

## 2013-06-26 DIAGNOSIS — R209 Unspecified disturbances of skin sensation: Secondary | ICD-10-CM | POA: Insufficient documentation

## 2013-06-26 DIAGNOSIS — K219 Gastro-esophageal reflux disease without esophagitis: Secondary | ICD-10-CM | POA: Insufficient documentation

## 2013-06-26 DIAGNOSIS — F411 Generalized anxiety disorder: Secondary | ICD-10-CM | POA: Insufficient documentation

## 2013-06-26 DIAGNOSIS — R002 Palpitations: Secondary | ICD-10-CM | POA: Insufficient documentation

## 2013-06-26 DIAGNOSIS — R0602 Shortness of breath: Secondary | ICD-10-CM | POA: Insufficient documentation

## 2013-06-26 DIAGNOSIS — J45909 Unspecified asthma, uncomplicated: Secondary | ICD-10-CM | POA: Insufficient documentation

## 2013-06-26 DIAGNOSIS — N200 Calculus of kidney: Secondary | ICD-10-CM | POA: Insufficient documentation

## 2013-06-26 NOTE — ED Notes (Addendum)
Pt. reports intermittent left chest pain with SOB and tingling on both arms onset  today , no nausea or diaphoresis . Pt. Reported that he is taking Phentermine Hcl for weight loss.

## 2013-06-27 ENCOUNTER — Emergency Department (HOSPITAL_COMMUNITY)
Admission: EM | Admit: 2013-06-27 | Discharge: 2013-06-27 | Disposition: A | Discharge status: Home / Self Care | Payer: Self-pay | Attending: Emergency Medicine | Admitting: Emergency Medicine

## 2013-06-27 ENCOUNTER — Emergency Department (HOSPITAL_COMMUNITY): Payer: Self-pay

## 2013-06-27 DIAGNOSIS — R079 Chest pain, unspecified: Secondary | ICD-10-CM

## 2013-06-27 DIAGNOSIS — R002 Palpitations: Secondary | ICD-10-CM

## 2013-06-27 HISTORY — DX: Anxiety disorder, unspecified: F41.9

## 2013-06-27 LAB — CBC
HEMATOCRIT: 48.3 % (ref 39.0–52.0)
HEMOGLOBIN: 16 g/dL (ref 13.0–17.0)
MCH: 30.3 pg (ref 26.0–34.0)
MCHC: 33.1 g/dL (ref 30.0–36.0)
MCV: 91.5 fL (ref 78.0–100.0)
Platelets: 314 10*3/uL (ref 150–400)
RBC: 5.28 MIL/uL (ref 4.22–5.81)
RDW: 13.5 % (ref 11.5–15.5)
WBC: 14.2 10*3/uL — AB (ref 4.0–10.5)

## 2013-06-27 LAB — BASIC METABOLIC PANEL
BUN: 20 mg/dL (ref 6–23)
CHLORIDE: 97 meq/L (ref 96–112)
CO2: 26 mEq/L (ref 19–32)
Calcium: 9.2 mg/dL (ref 8.4–10.5)
Creatinine, Ser: 1.48 mg/dL — ABNORMAL HIGH (ref 0.50–1.35)
GFR, EST AFRICAN AMERICAN: 69 mL/min — AB (ref >90)
GFR, EST NON AFRICAN AMERICAN: 59 mL/min — AB (ref >90)
GLUCOSE: 102 mg/dL — AB (ref 70–99)
POTASSIUM: 4 meq/L (ref 3.7–5.3)
SODIUM: 135 meq/L — AB (ref 137–147)

## 2013-06-27 LAB — POCT I-STAT TROPONIN I
TROPONIN I, POC: 0 ng/mL (ref 0.00–0.08)
TROPONIN I, POC: 0.01 ng/mL (ref 0.00–0.08)

## 2013-06-27 LAB — PRO B NATRIURETIC PEPTIDE: Pro B Natriuretic peptide (BNP): 9.3 pg/mL (ref 0–125)

## 2013-06-27 MED ORDER — ASPIRIN 81 MG PO CHEW
324.0000 mg | CHEWABLE_TABLET | Freq: Once | ORAL | Status: AC
  Administered 2013-06-27: 324 mg via ORAL
  Filled 2013-06-27: qty 4

## 2013-06-27 MED ORDER — NITROGLYCERIN 0.4 MG SL SUBL
0.4000 mg | SUBLINGUAL_TABLET | SUBLINGUAL | Status: DC | PRN
  Administered 2013-06-27 (×2): 0.4 mg via SUBLINGUAL
  Filled 2013-06-27: qty 25

## 2013-06-27 NOTE — ED Provider Notes (Signed)
CSN: [id number]     Arrival date & time 06/26/13  2314 History   First MD Initiated Contact with Patient 06/27/13 0209     Chief Complaint  Patient presents with  . Chest Pain   (Consider location/radiation/quality/duration/timing/severity/associated sxs/prior Treatment) Patient is a 38 y.o. male presenting with chest pain. The history is provided by the patient.  Chest Pain He came to the ED because of chest pain. He started taking phentermine yesterday for weight loss. Yesterday, he had some palpitations. Tonight, he started having some left sided chest pressure at about 7PM. He was also having sharp, shooting pains which lasted 4-5 seconds. There was mild dyspnea, but no nausea or diaphoresis. Nothing made pain better or worse. He also had more episodes of palpitations. Pain is rated at 9/10. He got worried when he started having some numbness in his hands. He does smoke one PPD, and has a strong family history of CAD. No history of hypertension, diabetes, or hyperlipidemia. He has not had similar symptoms in the past. He did not do anything at home to treat his symptoms. .  Past Medical History  Diagnosis Date  . Asthma   . Kidney stones, mixed calcium oxalate   . GERD (gastroesophageal reflux disease)   . Bulging lumbar disc   . Anxiety    Past Surgical History  Procedure Laterality Date  . Cystoscopy w/ ureteral stent placement    . Extracorporeal shock wave lithotripsy    . Hernia repair    . Cervical disc arthroplasty    . Tonsillectomy    . Appendectomy     No family history on file. History  Substance Use Topics  . Smoking status: Current Every Day Smoker -- 0.50 packs/day    Types: Cigarettes  . Smokeless tobacco: Not on file  . Alcohol Use: Yes     Comment: social    Review of Systems  Cardiovascular: Positive for chest pain.  All other systems reviewed and are negative.    Allergies  Review of patient's allergies indicates no known allergies.  Home  Medications   Current Outpatient Rx  Name  Route  Sig  Dispense  Refill  . albuterol (PROVENTIL HFA;VENTOLIN HFA) 108 (90 BASE) MCG/ACT inhaler   Inhalation   Inhale 2 puffs into the lungs every 4 (four) hours as needed for shortness of breath. For shortness of breath         . HYDROcodone-acetaminophen (NORCO/VICODIN) 5-325 MG per tablet   Oral   Take 2 tablets by mouth every 4 (four) hours as needed for pain.   6 tablet   0   . ibuprofen (ADVIL,MOTRIN) 200 MG tablet   Oral   Take 600 mg by mouth every 6 (six) hours as needed for pain.         . pantoprazole (PROTONIX) 40 MG tablet   Oral   Take 40 mg by mouth daily.         . traMADol (ULTRAM) 50 MG tablet   Oral   Take 1 tablet (50 mg total) by mouth every 6 (six) hours as needed for pain.   15 tablet   0    BP 137/105  Pulse 96  Temp(Src) 97.5 F (36.4 C) (Oral)  Resp 18  SpO2 94% Physical Exam  Nursing note and vitals reviewed.  38 year old male, resting comfortably and in no acute distress. Vital signs are significant for hypertension with BP 133/106. Oxygen saturation is 99%, which is normal. Head is  normocephalic and atraumatic. PERRLA, EOMI. Oropharynx is clear. Neck is nontender and supple without adenopathy or JVD. Back is nontender and there is no CVA tenderness. Lungs are clear without rales, wheezes, or rhonchi. Chest is nontender. Heart has regular rate and rhythm without murmur. Abdomen is soft, flat, nontender without masses or hepatosplenomegaly and peristalsis is normoactive. Extremities have no cyanosis or edema, full range of motion is present. Skin is warm and dry without rash. Neurologic: Mental status is normal, cranial nerves are intact, there are no motor or sensory deficits.  ED Course  Procedures (including critical care time) Labs Review Labs Reviewed  CBC - Abnormal; Notable for the following:    WBC 14.2 (*)    All other components within normal limits  BASIC METABOLIC  PANEL - Abnormal; Notable for the following:    Sodium 135 (*)    Glucose, Bld 102 (*)    Creatinine, Ser 1.48 (*)    GFR calc non Af Amer 59 (*)    GFR calc Af Amer 69 (*)    All other components within normal limits  PRO B NATRIURETIC PEPTIDE  POCT I-STAT TROPONIN I   Imaging Review Dg Chest 2 View  06/27/2013   CLINICAL DATA:  Chest pain  EXAM: CHEST  2 VIEW  COMPARISON:  05/18/2012  FINDINGS: Low lung volumes with basilar atelectasis and interstitial crowding. No evidence of consolidation or edema. No effusion or pneumothorax. Normal heart size when accounting for mediastinal fat pad noted on prior CT imaging.  IMPRESSION: Low lung volumes with mild basilar atelectasis.   Electronically Signed   By: Jorje Guild M.D.   On: 06/27/2013 00:28    EKG Interpretation    Date/Time:  Sunday June 26 2013 23:19:27 EST Ventricular Rate:  100 PR Interval:  150 QRS Duration: 102 QT Interval:  354 QTC Calculation: 456 R Axis:   104 Text Interpretation:  Normal sinus rhythm Rightward axis Abnormal QRS-T angle, consider primary T wave abnormality Abnormal ECG Confirmed by OPITZ  MD, BRIAN ([id number]) on 06/26/2013 11:27:10 PM            MDM   1. Chest pain   2. Palpitations    Chest pain which is most likely related to his weight loss medication. Initial troponin is negative. He will be given a trial of NTG, and is also given aspirin.  Chest pain resolved with 2 nitroglycerin. Patient was advised to wait in the ED for a repeat troponin but he did not wish to stay. Risk of heart attack with initial troponin is negative was explained to the patient and to his wife and I encouraged him to stay. However, he did not wish to stay. He acknowledged the risk of heart attack and stated he still wished to go home. He is allowed to leave but is told to return should he change his mind. He is advised to stop taking his diet pill, and told to take one aspirin daily. He states he will see his physician  later today.  Delora Fuel, MD 86/57/84 [id number]

## 2013-06-27 NOTE — Discharge Instructions (Signed)
Stop taking your diet pill.  You are leaving before I could get a repeat blood test of your heart enzyme troponin. Sometimes there is a heart attack with a normal initial blood test, but follow-up tests show the damage. You did not want to stay for the follow-up blood test. That means that I cannot be sure you did not have a heart attack. Heart attacks that happen outside the hospital can be fatal.  Take one aspirin tablet a day. See your doctor as soon as possible. Return to the ED if you change your mind.  Chest Pain (Nonspecific) It is often hard to give a specific diagnosis for the cause of chest pain. There is always a chance that your pain could be related to something serious, such as a heart attack or a blood clot in the lungs. You need to follow up with your caregiver for further evaluation. CAUSES   Heartburn.  Pneumonia or bronchitis.  Anxiety or stress.  Inflammation around your heart (pericarditis) or lung (pleuritis or pleurisy).  A blood clot in the lung.  A collapsed lung (pneumothorax). It can develop suddenly on its own (spontaneous pneumothorax) or from injury (trauma) to the chest.  Shingles infection (herpes zoster virus). The chest wall is composed of bones, muscles, and cartilage. Any of these can be the source of the pain.  The bones can be bruised by injury.  The muscles or cartilage can be strained by coughing or overwork.  The cartilage can be affected by inflammation and become sore (costochondritis). DIAGNOSIS  Lab tests or other studies, such as X-rays, electrocardiography, stress testing, or cardiac imaging, may be needed to find the cause of your pain.  TREATMENT   Treatment depends on what may be causing your chest pain. Treatment may include:  Acid blockers for heartburn.  Anti-inflammatory medicine.  Pain medicine for inflammatory conditions.  Antibiotics if an infection is present.  You may be advised to change lifestyle habits. This  includes stopping smoking and avoiding alcohol, caffeine, and chocolate.  You may be advised to keep your head raised (elevated) when sleeping. This reduces the chance of acid going backward from your stomach into your esophagus.  Most of the time, nonspecific chest pain will improve within 2 to 3 days with rest and mild pain medicine. HOME CARE INSTRUCTIONS   If antibiotics were prescribed, take your antibiotics as directed. Finish them even if you start to feel better.  For the next few days, avoid physical activities that bring on chest pain. Continue physical activities as directed.  Do not smoke.  Avoid drinking alcohol.  Only take over-the-counter or prescription medicine for pain, discomfort, or fever as directed by your caregiver.  Follow your caregiver's suggestions for further testing if your chest pain does not go away.  Keep any follow-up appointments you made. If you do not go to an appointment, you could develop lasting (chronic) problems with pain. If there is any problem keeping an appointment, you must call to reschedule. SEEK MEDICAL CARE IF:   You think you are having problems from the medicine you are taking. Read your medicine instructions carefully.  Your chest pain does not go away, even after treatment.  You develop a rash with blisters on your chest. SEEK IMMEDIATE MEDICAL CARE IF:   You have increased chest pain or pain that spreads to your arm, neck, jaw, back, or abdomen.  You develop shortness of breath, an increasing cough, or you are coughing up blood.  You have  severe back or abdominal pain, feel nauseous, or vomit.  You develop severe weakness, fainting, or chills.  You have a fever. THIS IS AN EMERGENCY. Do not wait to see if the pain will go away. Get medical help at once. Call your local emergency services (911 in [place] Do not drive yourself to the hospital. MAKE SURE YOU:   Understand these instructions.  Will watch your  condition.  Will get help right away if you are not doing well or get worse. Document Released: 03/12/2005 Document Revised: 08/25/2011 Document Reviewed: 01/06/2008 [facility] [place] Patient Information 2014 [place].  Palpitations  A palpitation is the feeling that your heartbeat is irregular or is faster than normal. It may feel like your heart is fluttering or skipping a beat. Palpitations are usually not a serious problem. However, in some cases, you may need further medical evaluation. CAUSES  Palpitations can be caused by:  Smoking.  Caffeine or other stimulants, such as diet pills or energy drinks.  Alcohol.  Stress and anxiety.  Strenuous physical activity.  Fatigue.  Certain medicines.  Heart disease, especially if you have a history of arrhythmias. This includes atrial fibrillation, atrial flutter, or supraventricular tachycardia.  An improperly working pacemaker or defibrillator. DIAGNOSIS  To find the cause of your palpitations, your caregiver will take your history and perform a physical exam. Tests may also be done, including:  Electrocardiography (ECG). This test records the heart's electrical activity.  Cardiac monitoring. This allows your caregiver to monitor your heart rate and rhythm in real time.  Holter monitor. This is a portable device that records your heartbeat and can help diagnose heart arrhythmias. It allows your caregiver to track your heart activity for several days, if needed.  Stress tests by exercise or by giving medicine that makes the heart beat faster. TREATMENT  Treatment of palpitations depends on the cause of your symptoms and can vary greatly. Most cases of palpitations do not require any treatment other than time, relaxation, and monitoring your symptoms. Other causes, such as atrial fibrillation, atrial flutter, or supraventricular tachycardia, usually require further treatment. HOME CARE INSTRUCTIONS   Avoid:  Caffeinated coffee,  tea, soft drinks, diet pills, and energy drinks.  Chocolate.  Alcohol.  Stop smoking if you smoke.  Reduce your stress and anxiety. Things that can help you relax include:  A method that measures bodily functions so you can learn to control them (biofeedback).  Yoga.  Meditation.  Physical activity such as swimming, jogging, or walking.  Get plenty of rest and sleep. SEEK MEDICAL CARE IF:   You continue to have a fast or irregular heartbeat beyond 24 hours.  Your palpitations occur more often. SEEK IMMEDIATE MEDICAL CARE IF:  You develop chest pain or shortness of breath.  You have a severe headache.  You feel dizzy, or you faint. MAKE SURE YOU:  Understand these instructions.  Will watch your condition.  Will get help right away if you are not doing well or get worse. Document Released: 05/30/2000 Document Revised: 09/27/2012 Document Reviewed: 08/01/2011 [facility] Patient Information 2014 [place].

## 2013-06-27 NOTE — ED Notes (Signed)
Pt left with out paperwork. Roxanne Mins MD discussed follow up care if he did not wait to continue further treatment. Pt pulled out his IV, did not sign discharge and left.

## 2013-09-27 ENCOUNTER — Emergency Department (HOSPITAL_COMMUNITY)
Admission: EM | Admit: 2013-09-27 | Discharge: 2013-09-27 | Disposition: A | Discharge status: Home / Self Care | Payer: BC Managed Care – PPO | Attending: Emergency Medicine | Admitting: Emergency Medicine

## 2013-09-27 ENCOUNTER — Encounter (HOSPITAL_COMMUNITY): Payer: Self-pay | Admitting: Emergency Medicine

## 2013-09-27 DIAGNOSIS — Z79899 Other long term (current) drug therapy: Secondary | ICD-10-CM | POA: Insufficient documentation

## 2013-09-27 DIAGNOSIS — K219 Gastro-esophageal reflux disease without esophagitis: Secondary | ICD-10-CM | POA: Insufficient documentation

## 2013-09-27 DIAGNOSIS — F17200 Nicotine dependence, unspecified, uncomplicated: Secondary | ICD-10-CM | POA: Insufficient documentation

## 2013-09-27 DIAGNOSIS — Z87442 Personal history of urinary calculi: Secondary | ICD-10-CM | POA: Insufficient documentation

## 2013-09-27 DIAGNOSIS — J45901 Unspecified asthma with (acute) exacerbation: Secondary | ICD-10-CM | POA: Insufficient documentation

## 2013-09-27 DIAGNOSIS — F419 Anxiety disorder, unspecified: Secondary | ICD-10-CM

## 2013-09-27 DIAGNOSIS — F411 Generalized anxiety disorder: Secondary | ICD-10-CM | POA: Insufficient documentation

## 2013-09-27 DIAGNOSIS — Z8739 Personal history of other diseases of the musculoskeletal system and connective tissue: Secondary | ICD-10-CM | POA: Insufficient documentation

## 2013-09-27 NOTE — ED Notes (Signed)
Pt discharged home with all belongings, pt alert, oriented, and ambulatory upon discharge, no new RX prescribed, pt verbalizes understanding of discharge instructions, pt driven home by spouse

## 2013-09-27 NOTE — ED Notes (Signed)
Pt presents to ED with concern for sudden onset of feeling weak, dizzy, Left side chest pressure, SOB, hot flash across his forehead, light headed, heavy feeling to bilateral arms, and tingling feeling to Left arm. Pt states his symptoms started 0745 this am while getting out of the shower. 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 Pt was prescribed Xanax October 2014 after his father's passing and a "Mild heart attack." Pt states he quit taking the Xanax 2 weeks ago, started with Xanax 1 mg BID in October and was decreased to Xanax 0.5 mg BID until he gradually decreased to every other day and then just discontinued all together.

## 2013-09-27 NOTE — ED Notes (Signed)
MD Wentz at bedside.  

## 2013-09-27 NOTE — ED Provider Notes (Signed)
CSN: [id number]     Arrival date & time 09/27/13  2130 History   First MD Initiated Contact with Patient 09/27/13 1137     Chief Complaint  Patient presents with  . Anxiety  . Shortness of Breath     (Consider location/radiation/quality/duration/timing/severity/associated sxs/prior Treatment) Patient is a 38 y.o. male presenting with anxiety and shortness of breath. The history is provided by the patient.  Anxiety Associated symptoms include shortness of breath.  Shortness of Breath  He presents for evaluation of a period of her knees that occurred yesterday and lasted about 30 minutes. It was characterized by feeling anxious dizzy and generalized weakness. The weakness persisted until this morning. He was able to eat last night, but did not eat this morning, as is usual for him. He noticed some chest tingling this morning, and felt concerned about called his doctor. Dr. advised him to come here for evaluation. The chest tingling resolved during EMS transport. He was not treated with any medication. He has not had similar problems in the past. He stopped taking Xanax 2 weeks ago, which he was using intermittently for periods of anxiety following his father's death. She reports stress regarding issues at home and work. He owns a business. He is soon to move in with his mother. There are no other known modifying factors.  Past Medical History  Diagnosis Date  . Asthma   . Kidney stones, mixed calcium oxalate   . GERD (gastroesophageal reflux disease)   . Bulging lumbar disc   . Anxiety    Past Surgical History  Procedure Laterality Date  . Cystoscopy w/ ureteral stent placement    . Extracorporeal shock wave lithotripsy    . Hernia repair    . Cervical disc arthroplasty    . Tonsillectomy    . Appendectomy     History reviewed. No pertinent family history. History  Substance Use Topics  . Smoking status: Current Every Day Smoker -- 0.50 packs/day    Types: Cigarettes  .  Smokeless tobacco: Not on file  . Alcohol Use: Yes     Comment: social    Review of Systems  Respiratory: Positive for shortness of breath.   All other systems reviewed and are negative.     Allergies  Hydrocodone  Home Medications   Prior to Admission medications   Medication Sig Start Date End Date Taking? Authorizing Provider  ALPRAZolam Duanne Moron) 1 MG tablet Take 1 mg by mouth 2 (two) times daily as needed for anxiety.   Yes Historical Provider, MD  atenolol (TENORMIN) 25 MG tablet Take 25 mg by mouth daily.   Yes Historical Provider, MD  lovastatin (MEVACOR) 20 MG tablet Take 20 mg by mouth at bedtime.   Yes Historical Provider, MD  nortriptyline (PAMELOR) 25 MG capsule Take 25 mg by mouth at bedtime.   Yes Historical Provider, MD  pantoprazole (PROTONIX) 40 MG tablet Take 40 mg by mouth daily.   Yes Historical Provider, MD  albuterol (PROVENTIL HFA;VENTOLIN HFA) 108 (90 BASE) MCG/ACT inhaler Inhale 2 puffs into the lungs every 4 (four) hours as needed for shortness of breath. For shortness of breath    Historical Provider, MD   BP 129/88  Pulse 63  Temp(Src) 97.8 F (36.6 C) (Oral)  Resp 18  SpO2 100% Physical Exam  Nursing note and vitals reviewed. Constitutional: He is oriented to person, place, and time. He appears well-developed and well-nourished.  HENT:  Head: Normocephalic and atraumatic.  Right Ear: External ear  normal.  Left Ear: External ear normal.  Eyes: Conjunctivae and EOM are normal. Pupils are equal, round, and reactive to light.  Neck: Normal range of motion and phonation normal. Neck supple.  Cardiovascular: Normal rate, regular rhythm, normal heart sounds and intact distal pulses.   Pulmonary/Chest: Effort normal and breath sounds normal. He exhibits no bony tenderness.  Abdominal: Soft. There is no tenderness.  Musculoskeletal: Normal range of motion.  Neurological: He is alert and oriented to person, place, and time. No cranial nerve deficit or  sensory deficit. He exhibits normal muscle tone. Coordination normal.  Skin: Skin is warm, dry and intact.  Psychiatric: He has a normal mood and affect. His behavior is normal. Judgment and thought content normal.    ED Course  Procedures (including critical care time)  Medications - No data to display  Patient Vitals for the past 24 hrs:  BP Temp Temp src Pulse Resp SpO2  09/27/13 1204 129/88 mmHg 97.8 F (36.6 C) Oral 63 18 100 %  09/27/13 0953 126/91 mmHg 98.4 F (36.9 C) Oral 62 18 100 %    Findings discussed with patient and wife, all questions answered.     Labs Review Labs Reviewed - No data to display  Imaging Review No results found.   EKG Interpretation   Date/Time:  Tuesday September 27 2013 09:57:27 EDT Ventricular Rate:  69 PR Interval:  178 QRS Duration: 106 QT Interval:  388 QTC Calculation: 415 R Axis:   2 Text Interpretation:  Normal sinus rhythm Normal ECG since last tracing no  significant change Confirmed by Eulis Foster  MD, Vira Agar ([id number]) on 09/27/2013  12:11:55 PM      MDM   Final diagnoses:  Anxiety    Symptoms consistent with panic attack in a patient with known anxiety. He recently stopped taking Xanax. Is no indication for persistent or ongoing cardiac ischemia or signs of respiratory distress or metabolic instability.  Nursing Notes Reviewed/ Care Coordinated Applicable Imaging Reviewed Interpretation of Laboratory Data incorporated into ED treatment  The patient appears reasonably screened and/or stabilized for discharge and I doubt any other medical condition or other [facility] requiring further screening, evaluation, or treatment in the ED at this time prior to discharge.  Plan: Home Medications- usual; Home Treatments- rest; return here if the recommended treatment, does not improve the symptoms; Recommended follow up- PCP, when necessary     Richarda Blade, MD 09/27/13 1214

## 2013-09-27 NOTE — Discharge Instructions (Signed)
Try to get plenty of rest, eat regularly and drink plenty of fluids. You can use your Xanax as needed for similar symptoms. Follow up with your primary care doctor for a checkup.     Panic Attacks Panic attacks are sudden, short feelings of great fear or discomfort. You may have them for no reason when you are relaxed, when you are uneasy (anxious), or when you are sleeping.  HOME CARE  Take all your medicines as told.  Check with your doctor before starting new medicines.  Keep all doctor visits. GET HELP IF:  You are not able to take your medicines as told.  Your symptoms do not get better.  Your symptoms get worse. GET HELP RIGHT AWAY IF:  Your attacks seem different than your normal attacks.  You have thoughts about hurting yourself or others.  You take panic attack medicine and you have a side effect. MAKE SURE YOU:  Understand these instructions.  Will watch your condition.  Will get help right away if you are not doing well or get worse. Document Released: 07/05/2010 Document Revised: 03/23/2013 Document Reviewed: 01/14/2013 [facility] Patient Information 2014 [place], [place].

## 2013-09-27 NOTE — ED Notes (Signed)
Reports going to work this am, sudden onset of chest pressure, sob, tingling sensation to extremities and head. Hx of anxiety and was prescrbed xanax but pt doesn't take it. Cp has resolved, still reports tingling and mild sob and generalized fatigue. ekg done at triage, airway intact.

## 2013-09-27 NOTE — ED Notes (Signed)
Pt still in room going over discharge with registration

## 2013-09-30 ENCOUNTER — Emergency Department (HOSPITAL_COMMUNITY): Payer: BC Managed Care – PPO

## 2013-09-30 ENCOUNTER — Emergency Department (HOSPITAL_COMMUNITY)
Admission: EM | Admit: 2013-09-30 | Discharge: 2013-09-30 | Disposition: A | Discharge status: Home / Self Care | Payer: BC Managed Care – PPO | Attending: Emergency Medicine | Admitting: Emergency Medicine

## 2013-09-30 ENCOUNTER — Encounter (HOSPITAL_COMMUNITY): Payer: Self-pay | Admitting: Emergency Medicine

## 2013-09-30 DIAGNOSIS — F17200 Nicotine dependence, unspecified, uncomplicated: Secondary | ICD-10-CM | POA: Insufficient documentation

## 2013-09-30 DIAGNOSIS — R569 Unspecified convulsions: Secondary | ICD-10-CM

## 2013-09-30 DIAGNOSIS — Z79899 Other long term (current) drug therapy: Secondary | ICD-10-CM | POA: Insufficient documentation

## 2013-09-30 DIAGNOSIS — J45909 Unspecified asthma, uncomplicated: Secondary | ICD-10-CM | POA: Insufficient documentation

## 2013-09-30 DIAGNOSIS — F411 Generalized anxiety disorder: Secondary | ICD-10-CM | POA: Insufficient documentation

## 2013-09-30 DIAGNOSIS — Z8739 Personal history of other diseases of the musculoskeletal system and connective tissue: Secondary | ICD-10-CM | POA: Insufficient documentation

## 2013-09-30 DIAGNOSIS — K219 Gastro-esophageal reflux disease without esophagitis: Secondary | ICD-10-CM | POA: Insufficient documentation

## 2013-09-30 DIAGNOSIS — Z87442 Personal history of urinary calculi: Secondary | ICD-10-CM | POA: Insufficient documentation

## 2013-09-30 DIAGNOSIS — R51 Headache: Secondary | ICD-10-CM | POA: Insufficient documentation

## 2013-09-30 LAB — CBC
HEMATOCRIT: 44.5 % (ref 39.0–52.0)
HEMOGLOBIN: 15.3 g/dL (ref 13.0–17.0)
MCH: 32.2 pg (ref 26.0–34.0)
MCHC: 34.4 g/dL (ref 30.0–36.0)
MCV: 93.7 fL (ref 78.0–100.0)
Platelets: 257 10*3/uL (ref 150–400)
RBC: 4.75 MIL/uL (ref 4.22–5.81)
RDW: 13.6 % (ref 11.5–15.5)
WBC: 11.9 10*3/uL — ABNORMAL HIGH (ref 4.0–10.5)

## 2013-09-30 LAB — CBG MONITORING, ED: Glucose-Capillary: 80 mg/dL (ref 70–99)

## 2013-09-30 LAB — URINALYSIS, ROUTINE W REFLEX MICROSCOPIC
Bilirubin Urine: NEGATIVE
GLUCOSE, UA: NEGATIVE mg/dL
HGB URINE DIPSTICK: NEGATIVE
KETONES UR: NEGATIVE mg/dL
Leukocytes, UA: NEGATIVE
Nitrite: NEGATIVE
PROTEIN: NEGATIVE mg/dL
Specific Gravity, Urine: 1.025 (ref 1.005–1.030)
Urobilinogen, UA: 0.2 mg/dL (ref 0.0–1.0)
pH: 5 (ref 5.0–8.0)

## 2013-09-30 LAB — RAPID URINE DRUG SCREEN, HOSP PERFORMED
AMPHETAMINES: NOT DETECTED
BARBITURATES: NOT DETECTED
Benzodiazepines: POSITIVE — AB
Cocaine: NOT DETECTED
Opiates: NOT DETECTED
TETRAHYDROCANNABINOL: POSITIVE — AB

## 2013-09-30 LAB — BASIC METABOLIC PANEL
BUN: 17 mg/dL (ref 6–23)
CO2: 23 mEq/L (ref 19–32)
CREATININE: 1.03 mg/dL (ref 0.50–1.35)
Calcium: 9.2 mg/dL (ref 8.4–10.5)
Chloride: 106 mEq/L (ref 96–112)
GFR calc Af Amer: >90 mL/min (ref >90)
GFR calc non Af Amer: >90 mL/min (ref >90)
GLUCOSE: 83 mg/dL (ref 70–99)
POTASSIUM: 4.5 meq/L (ref 3.7–5.3)
Sodium: 143 mEq/L (ref 137–147)

## 2013-09-30 MED ORDER — GADOBENATE DIMEGLUMINE 529 MG/ML IV SOLN
20.0000 mL | Freq: Once | INTRAVENOUS | Status: AC
  Administered 2013-09-30: 20 mL via INTRAVENOUS

## 2013-09-30 MED ORDER — LEVETIRACETAM 500 MG PO TABS: 500.0000 mg | ORAL_TABLET | Freq: Two times a day (BID) | ORAL | Status: DC

## 2013-09-30 MED ORDER — NICOTINE 21 MG/24HR TD PT24
21.0000 mg | MEDICATED_PATCH | Freq: Once | TRANSDERMAL | Status: DC
  Administered 2013-09-30: 21 mg via TRANSDERMAL
  Filled 2013-09-30: qty 1

## 2013-09-30 MED ORDER — LORAZEPAM 2 MG/ML IJ SOLN
INTRAMUSCULAR | Status: AC
  Filled 2013-09-30: qty 1

## 2013-09-30 MED ORDER — SODIUM CHLORIDE 0.9 % IV BOLUS (SEPSIS)
1000.0000 mL | Freq: Once | INTRAVENOUS | Status: AC
  Administered 2013-09-30: 1000 mL via INTRAVENOUS

## 2013-09-30 MED ORDER — LORAZEPAM 2 MG/ML IJ SOLN
1.0000 mg | Freq: Once | INTRAMUSCULAR | Status: AC
  Administered 2013-09-30: 1 mg via INTRAVENOUS

## 2013-09-30 NOTE — ED Notes (Signed)
Pt becoming agitated about wait, delay explained, pt refusing to wear EKG monitor or BP cuff at this time, PA aware, orders received.

## 2013-09-30 NOTE — Discharge Instructions (Signed)
Seizure, Adult °A seizure is abnormal electrical activity in the brain. Seizures usually last from 30 seconds to 2 minutes. There are various types of seizures. °Before a seizure, you may have a warning sensation (aura) that a seizure is about to occur. An aura may include the following symptoms:  °· Fear or anxiety. °· Nausea. °· Feeling like the room is spinning (vertigo). °· Vision changes, such as seeing flashing lights or spots. °Common symptoms during a seizure include: °· A change in attention or behavior (altered mental status). °· Convulsions with rhythmic jerking movements. °· Drooling. °· Rapid eye movements. °· Grunting. °· Loss of bladder and bowel control. °· Bitter taste in the mouth. °· Tongue biting. °After a seizure, you may feel confused and sleepy. You may also have an injury resulting from convulsions during the seizure. °HOME CARE INSTRUCTIONS  °· If you are given medicines, take them exactly as prescribed by your health care provider. °· Keep all follow-up appointments as directed by your health care provider. °· Do not swim or drive or engage in risky activity during which a seizure could cause further injury to you or others until your health care provider says it is OK. °· Get adequate rest. °· Teach friends and family what to do if you have a seizure. They should: °· Lay you on the ground to prevent a fall. °· Put a cushion under your head. °· Loosen any tight clothing around your neck. °· Turn you on your side. If vomiting occurs, this helps keep your airway clear. °· Stay with you until you recover. °· Know whether or not you need emergency care. °SEEK IMMEDIATE MEDICAL CARE IF: °· The seizure lasts longer than 5 minutes. °· The seizure is severe or you do not wake up immediately after the seizure. °· You have an altered mental status after the seizure. °· You are having more frequent or worsening seizures. °Someone should drive you to the emergency department or call local emergency  services (911 in [place] °MAKE SURE YOU: °· Understand these instructions. °· Will watch your condition. °· Will get help right away if you are not doing well or get worse. °Document Released: 05/30/2000 Document Revised: 03/23/2013 Document Reviewed: 01/12/2013 °[employer]® Patient Information ©2014 [employer]. ° °Driving and Equipment Restrictions °Some medical problems make it dangerous to drive, ride a bike, or use machines. Some of these problems are: °· A hard blow to the head (concussion). °· Passing out (fainting). °· Twitching and shaking (seizures). °· Low blood sugar. °· Taking medicine to help you relax (sedatives). °· Taking pain medicines. °· Wearing an eye patch. °· Wearing splints. This can make it hard to use parts of your body that you need to drive safely. °HOME CARE  °· Do not drive until your doctor says it is okay. °· Do not use machines until your doctor says it is okay. °You may need a form signed by your doctor (medical release) before you can drive again. You may also need this form before you do other tasks where you need to be fully alert. °MAKE SURE YOU: °· Understand these instructions. °· Will watch your condition. °· Will get help right away if you are not doing well or get worse. °Document Released: 07/10/2004 Document Revised: 08/25/2011 Document Reviewed: 10/10/2009 °[employer]® Patient Information ©2014 [employer]. ° °

## 2013-09-30 NOTE — ED Notes (Addendum)
Per pt sts seizure last night. sts fell beside the toilet. sts extreme HA and trouble getting his words out. sts metal taste in his mouth. sts he also had a seizure Tuesday and bit his tongue. sts when he closes his eyes he sees flashes. No hx of seizures Prior to this week. PCP sent him here for MRI.

## 2013-09-30 NOTE — ED Notes (Signed)
Pt in c/o possible seizures since Tuesday, states he was seen here and dx with anxiety attack, went to his PMD and was told that maybe it was a seizure, started back on his xanax at that time, since then, every day has had these episodes where he dry heaves and shakes, has trouble forming words but moves around with purpose- for example tries to get to the bathroom and will tell his wife to call 911. Last night during episode she called his PMD and he recommended they call EMS and patient was able to communicate with her not to call. Pt c/o intermittent headaches and also flashes of light behind his eyes when his eyes are closed. Another episode patient reports he had a syncopal episode where he was shaving and the next thing he knows his wife is helping him up from the floor, does not recall events or feeling bad prior. Denies pain at this time but states he feels like he is having trouble forming thoughts. Alert and oriented, answers questions appropriately.

## 2013-09-30 NOTE — ED Notes (Signed)
Pt is aware of the urine sample of needed, urinal is at the bedside

## 2013-09-30 NOTE — ED Provider Notes (Signed)
CSN: [id number]     Arrival date & time 09/30/13  1103 History   First MD Initiated Contact with Patient 09/30/13 1334     Chief Complaint  Patient presents with  . Seizures     (Consider location/radiation/quality/duration/timing/severity/associated sxs/prior Treatment) HPI  38 year old male with history of anxiety, asthma, and GERD who presents for evaluation of possible seizure. History obtained through patient and the wife at bedside. Patient report using his father past year and he has been: Stress. He was placed on Xanax and was taking it daily but he quit cold [place] about 3 weeks ago. 4 days ago he was having a constellation of symptoms including chest pain, feeling uneasy, having anxiety, and having tremors along with tingling sensation to his hands and feet. He was seen in the ED and was diagnosed with having a panic attack.  He follow up with his PCP on the second day and was prescribed Xanax. That night he woke up noticing that he bit his tongue. He thought it may be related to a seizure episode. The next day we was found laying in the bathroom with an apparent syncopal episode while shaving.  Report hitting his elbow but denies any significant injury.  Wife found him in the bath room and helped him up.  Last night he had an episode where he was having trouble forming words, having body tremors, and subsequently had a convulsion when he drops down to his bed and seized for about 8-10 minutes, witnessed by wife.  He subsequently endorse headache and had a post ictal state.  Now patient is continues to endorse having difficulty thinking, having metallic taste in mouth, and report mild headache with occasional flashes of light when he closes his eyes.  No prior hx of seizure.  Denies any fever, chills, or double vision, denies chest pain, shortness of breath, abdominal pain or back pain, numbness or weakness. Does report having today with a lot of stress both with work and at home.  Denies  recent alcohol or street drug use.  No recent sickness.   Past Medical History  Diagnosis Date  . Asthma   . Kidney stones, mixed calcium oxalate   . GERD (gastroesophageal reflux disease)   . Bulging lumbar disc   . Anxiety    Past Surgical History  Procedure Laterality Date  . Cystoscopy w/ ureteral stent placement    . Extracorporeal shock wave lithotripsy    . Hernia repair    . Cervical disc arthroplasty    . Tonsillectomy    . Appendectomy     History reviewed. No pertinent family history. History  Substance Use Topics  . Smoking status: Current Every Day Smoker -- 0.50 packs/day    Types: Cigarettes  . Smokeless tobacco: Not on file  . Alcohol Use: Yes     Comment: social    Review of Systems  All other systems reviewed and are negative.     Allergies  Hydrocodone  Home Medications   Prior to Admission medications   Medication Sig Start Date End Date Taking? Authorizing Provider  ALPRAZolam Duanne Moron) 0.5 MG tablet Take 0.5 mg by mouth 2 (two) times daily as needed for anxiety.   Yes Historical Provider, MD  atenolol (TENORMIN) 25 MG tablet Take 25 mg by mouth daily.   Yes Historical Provider, MD  lovastatin (MEVACOR) 20 MG tablet Take 20 mg by mouth at bedtime.   Yes Historical Provider, MD  nortriptyline (PAMELOR) 25 MG capsule Take 25 mg  by mouth at bedtime.   Yes Historical Provider, MD  pantoprazole (PROTONIX) 40 MG tablet Take 40 mg by mouth daily.   Yes Historical Provider, MD  albuterol (PROVENTIL HFA;VENTOLIN HFA) 108 (90 BASE) MCG/ACT inhaler Inhale 2 puffs into the lungs every 4 (four) hours as needed for shortness of breath. For shortness of breath    Historical Provider, MD   BP 112/84  Pulse 72  Temp(Src) 97.7 F (36.5 C) (Oral)  Resp 18  SpO2 94% Physical Exam  Nursing note and vitals reviewed. Constitutional: He is oriented to person, place, and time. He appears well-developed and well-nourished. No distress.  Awake, alert, nontoxic  appearance  HENT:  Head: Normocephalic and atraumatic.  No tongue biting  Eyes: Conjunctivae and EOM are normal. Pupils are equal, round, and reactive to light. Right eye exhibits no discharge. Left eye exhibits no discharge.  Neck: Normal range of motion. Neck supple.  No nuchal rigidity  Cardiovascular: Normal rate and regular rhythm.  Exam reveals no gallop and no friction rub.   No murmur heard. Pulmonary/Chest: Effort normal. No respiratory distress. He exhibits no tenderness.  Abdominal: Soft. There is no tenderness. There is no rebound.  Musculoskeletal: He exhibits no tenderness.  ROM appears intact, no obvious focal weakness  Neurological: He is alert and oriented to person, place, and time.   A&O x 3, sppech clear, cognition appears to be normal, CN II-XII grossly normal - face symmetric, no deviation tongue, PERRLA EOMI, normal shoulder shrug; MS 5/5 throughout; DTRs 2+ and symmetrical; cerebellar - normal finger-nose, heel-shin, rapid alternating finger movement, no dysdiadochokinesia, nl gait; cerebellar - no tremor, no cogwheeling, normal gait, mild difficulty with tandem gait.    Skin: Skin is warm and dry. No rash noted.  Psychiatric: He has a normal mood and affect.    ED Course  Procedures (including critical care time)  2:02 PM Pt with new onset seizure witnessed by wife.  Has been dealing with a lot of stress.  Also was taking xanax regularly but stopped 3 weeks ago.  Work up initiated.  No prior hx of cancer but does have a strong family of cancer  3:36 PM Head CT demonstrates an asymmetric hypodensity in the left thalamus measuring 5x48mm.  Recommend MRI.  I discussed this with neurologist, Dr. Leonel Ramsay who recommend brain MRI with contrast for further evaluation.  Care discussed with Dr. Alvino Chapel and with pt.  MRI ordered.    7:12 PM Patient received Ativan here. Brain MRI with and without contrast shows a normal appearance of the brain. There is a prominent  perivascular space which is a normal anatomic variant demonstrated on the MRI and consistent with the CT finding. His results were discussed with patient. Patient will be discharged with Keppra and close followup with neurologist for further management including EEG study. I recommend patient to avoid driving and understanding the restriction with new onset seizure.  Return precaution discussed.    Labs Review Labs Reviewed  CBC - Abnormal; Notable for the following:    WBC 11.9 (*)    All other components within normal limits  URINE RAPID DRUG SCREEN (HOSP PERFORMED) - Abnormal; Notable for the following:    Benzodiazepines POSITIVE (*)    Tetrahydrocannabinol POSITIVE (*)    All other components within normal limits  BASIC METABOLIC PANEL  URINALYSIS, ROUTINE W REFLEX MICROSCOPIC  CBG MONITORING, ED    Imaging Review Ct Head Wo Contrast   (if New Onset Seizure And/or Head Trauma)  09/30/2013   CLINICAL DATA:  Seizure.  Headache.  EXAM: CT HEAD WITHOUT CONTRAST  TECHNIQUE: Contiguous axial images were obtained from the base of the skull through the vertex without contrast.  COMPARISON:  None  FINDINGS: No hemorrhage, visible mass lesion, hydrocephalus, or extra-axial fluid. There is a 5 x 7 mm hypodensity affecting the left thalamus which is asymmetric, representing an age indeterminate lesion, likely ischemic, although unusual given the patient's age of 25. No other similar abnormalities. No temporal lobe mass lesion. Calvarium intact. Sinuses and mastoids clear.  IMPRESSION: Asymmetric hypodensity in the left thalamus measuring 5 x 7 mm. This is of indeterminate significance and age. MRI could be helpful in further evaluation although it is unclear what relationship this abnormality may have to the patient's seizures.   Electronically Signed   By: Rolla Flatten M.D.   On: 09/30/2013 15:17   Mr Jeri Cos EH Contrast  09/30/2013   CLINICAL DATA:  38 year old male with seizures. Small hypodense  lesion in the left thalamus on CT today. Initial encounter.  EXAM: MRI HEAD WITHOUT AND WITH CONTRAST  TECHNIQUE: Multiplanar, multiecho pulse sequences of the brain and surrounding structures were obtained without and with intravenous contrast.  CONTRAST:  80mL MULTIHANCE GADOBENATE DIMEGLUMINE 529 MG/ML IV SOLN  COMPARISON:  Head CT without contrast 1504 hr the same day.  FINDINGS: Study is mildly degraded by motion artifact despite repeated imaging attempts.  Cerebral volume is normal. No restricted diffusion to suggest acute infarction. No midline shift, mass effect, evidence of mass lesion, ventriculomegaly, extra-axial collection or acute intracranial hemorrhage. Cervicomedullary junction and pituitary are within normal limits. Negative visualized cervical spine. Major intracranial vascular flow voids are preserved.  In the central left thalamus there is a cluster of small CSF isointense foci a most compatible with perivascular spaces (normal anatomic variant). This corresponds to the CT findings. Pearline Cables and white matter signal is within normal limits throughout the brain. No abnormal enhancement identified. Mesial temporal lobe structures are within normal limits.  Visible internal auditory structures appear normal. Mastoids are clear. Visualized orbit soft tissues are within normal limits. Minor paranasal sinus mucosal thickening. Normal bone marrow signal. Visualized scalp soft tissues are within normal limits.  IMPRESSION: Normal MRI appearance of the brain.  CT finding at the left thalamus corresponds to prominent perivascular spaces (a normal anatomic variant).   Electronically Signed   By: Lars Pinks M.D.   On: 09/30/2013 18:55     EKG Interpretation None      Date: 09/30/2013  Rate: 65  Rhythm: normal sinus rhythm  QRS Axis: normal  Intervals: normal  ST/T Wave abnormalities: nonspecific ST changes  Conduction Disutrbances:none  Narrative Interpretation:   Old EKG Reviewed: none  available    MDM   Final diagnoses:  New onset seizure    BP 124/82  Pulse 75  Temp(Src) 97.7 F (36.5 C) (Oral)  Resp 28  SpO2 97%  I have reviewed nursing notes and vital signs. I personally reviewed the imaging tests through PACS system  I reviewed available ER/hospitalization records thought the EMR     Domenic Moras, [place] 09/30/13 1914

## 2013-10-01 NOTE — ED Provider Notes (Signed)
Medical screening examination/treatment/procedure(s) were performed by non-physician practitioner and as supervising physician I was immediately available for consultation/collaboration.   EKG Interpretation None       Jasper Riling. Alvino Chapel, MD 10/01/13 2009

## 2013-10-12 ENCOUNTER — Encounter: Payer: Self-pay | Admitting: Neurology

## 2013-10-12 ENCOUNTER — Ambulatory Visit (INDEPENDENT_AMBULATORY_CARE_PROVIDER_SITE_OTHER): Payer: BC Managed Care – PPO | Admitting: Neurology

## 2013-10-12 VITALS — BP 120/80 | HR 72 | Temp 98.2°F | Ht 73.0 in | Wt 253.6 lb

## 2013-10-12 DIAGNOSIS — F411 Generalized anxiety disorder: Secondary | ICD-10-CM

## 2013-10-12 DIAGNOSIS — R569 Unspecified convulsions: Secondary | ICD-10-CM

## 2013-10-12 MED ORDER — LEVETIRACETAM 500 MG PO TABS: 500.0000 mg | ORAL_TABLET | Freq: Two times a day (BID) | ORAL | Status: DC

## 2013-10-12 NOTE — Patient Instructions (Addendum)
1. Schedule for routine EEG, then 24-hour EEG 2. Continue Keppra 500mg  twice a day 3. Follow-up in 2 months  Seizure precautions: 1. If medication has been prescribed for you to prevent seizures, take it exactly as directed.  Do not stop taking the medicine without talking to your doctor first, even if you have not had a seizure in a long time.   2. Avoid activities in which a seizure would cause danger to yourself or to others.  Don't operate dangerous machinery, swim alone, or climb in high or dangerous places, such as on ladders, roofs, or girders.  Do not drive unless your doctor says you may.  3. If you have any warning that you may have a seizure, lay down in a safe place where you can't hurt yourself.    4.  No driving for 6 months from last seizure, as per [facility].   Please refer to the following link on the [place] website for more information: http://[email or web address]/[email or web address]   5.  Maintain good sleep hygiene.  6.  Contact your doctor if you have any problems that may be related to the medicine you are taking.  7.  Call 911 and bring the patient back to the ED if:        A.  The seizure lasts longer than 5 minutes.       B.  The patient doesn't awaken shortly after the seizure  C.  The patient has new problems such as difficulty seeing, speaking or moving  D.  The patient was injured during the seizure  E.  The patient has a temperature over 102 F (39C)  F.  The patient vomited and now is having trouble breathing

## 2013-10-12 NOTE — Progress Notes (Signed)
NEUROLOGY CONSULTATION NOTE  [name] MRN: [id number] DOB: July 12, 1975  Referring provider: Dr. Antonietta Jewel Primary care provider: Dr. Antonietta Jewel  Reason for consult:  New onset seizures  Dear Dr Sheryle Hail:  Thank you for your kind referral of [name] for consultation of the above symptoms. Although his history is well known to you, please allow me to reiterate it for the purpose of our medical record. Records and images were personally reviewed where available.  HISTORY OF PRESENT ILLNESS: This is a pleasant 38 year old right-handed man with a history of hypertension, hyperlipidemia, and depression.  He has some difficulty relating the history, stating that there are "blank spots" that he cannot remember in the past 2 weeks, being in and out of the ER and his PCP's office.  Records from the ER were reviewed, on 09/26/2013, he started feeling dizzy, anxious, with generalized weakness.  He had some chest tingling and went to the ER the next day where EKG, CXR and troponin x 1 was normal.  He was diagnosed with a panic attack and did not want to stay for further workup.  He saw his PCP the next day and restarted Xanax, which he had stopped 3 weeks prior.  That night, he woke up with a tongue bite on the right side.  The next day, his wife found him on the bathroom floor where he apparently passed out while shaving.  On 04/16, he had trouble forming words, with body tremors, then had whole body shaking that lasted for 8-10 minutes. He tells me his wife said he sounded like he couldn't catch his breath, like he was dry heaving, shaking and gasping for air.  He reported a headache after and went to the ER the next day due to continued difficulty thinking, and mild headache.  He had a head CT which showed a hypodensity in the left basal ganglia.  I personally reviewed MRI brain with and without contrast which showed in the central left thalamus, a cluster of small CSF isointense foci most  compatible with perivascular spaces (normal anatomic variant).  No abnormal enhancement, symmetric hippocampi without increased signal noted.  His CBC and BMP were unremarkable except for mildly elevated WBC.  Urine drug screen positive for benzodiazepines and THC.  He was started on Keppra 500mg  BID.  He denies any further episodes of loss of consciousness since then, however he continues to feel lightheaded, lights bother him, shadows collapse and expand.  He sees glimpses of light out of the corners of both eyes.  He states that during the visit today, if he looks at something, he sees a shadow underneath then it slowly goes into focus.  He endorses a significant amount of stress since his father passed away from [place] [facility] in 03/21/13.  He feels like he has not had time to grieve.  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  He went to his PCP reporting excruciating pain in his temples and frontal region, relieved by an unrecalled pain medication.  No further similar pain but he has occasional pressure in his temples.  He is now  back to taking Xanax 0.5mg  4x/day for the past 2 weeks. He has been taking nortriptyline 25mg  qhs for depression since October 2014.    He denies any dysarthria, dysphagia, focal weakness, bowel/bladder dysfunction.  He has chronic neck and back pain due to bulging disks in the neck and prior neck surgery.  He has occasional tingling in his left arm. He denies any olfactory or gustatory hallucinations, staring/unresponsive episodes, myoclonic jerks.  He denies any irritability on Keppra and states he is pretty easy going most of the time.  Epilepsy Risk Factors:  He had a normal  birth and early development.  There is no history of febrile convulsions, CNS infections such as meningitis/encephalitis, significant traumatic brain injury, neurosurgical procedures, or family history of seizures.  PAST MEDICAL HISTORY: Past Medical History  Diagnosis Date  . Asthma   . Kidney stones, mixed calcium oxalate   . GERD (gastroesophageal reflux disease)   . Bulging lumbar disc   . Anxiety   . Seizures   . Syncope and collapse   . Headache(784.0)     PAST SURGICAL HISTORY: Past Surgical History  Procedure Laterality Date  . Cystoscopy w/ ureteral stent placement    . Extracorporeal shock wave lithotripsy    . Hernia repair    . Cervical disc arthroplasty    . Tonsillectomy    . Appendectomy      MEDICATIONS: Current Outpatient Prescriptions on File Prior to Visit  Medication Sig Dispense Refill  . albuterol (PROVENTIL HFA;VENTOLIN HFA) 108 (90 BASE) MCG/ACT inhaler Inhale 2 puffs into the lungs every 4 (four) hours as needed for shortness of breath. For shortness of breath      . ALPRAZolam (XANAX) 0.5 MG tablet Take 0.5 mg by mouth 2 (two) times daily as needed for anxiety.      Marland Kitchen atenolol (TENORMIN) 25 MG tablet Take 25 mg by mouth daily.      Marland Kitchen lovastatin (MEVACOR) 20 MG tablet Take 20 mg by mouth at bedtime.      . nortriptyline (PAMELOR) 25 MG capsule Take 25 mg by mouth at bedtime.      . pantoprazole (PROTONIX) 40 MG tablet Take 40 mg by mouth daily.       No current facility-administered medications on file prior to visit.    ALLERGIES: Allergies  Allergen Reactions  . Hydrocodone Itching and Nausea Only    FAMILY HISTORY: History reviewed. No pertinent family history.  SOCIAL HISTORY: History   Social History  . Marital Status: Married    Spouse Name: N/A    Number of Children: N/A  . Years of Education: N/A   Occupational History  . Not on file.   Social History Main Topics  . Smoking status: Current Every Day Smoker -- 0.50 packs/day     Types: Cigarettes  . Smokeless tobacco: Not on file  . Alcohol Use: Yes     Comment: social  . Drug Use: No  . Sexual Activity: Not on file   Other Topics Concern  . Not on file   Social History Narrative  . No narrative on file    REVIEW OF SYSTEMS: Constitutional: No fevers, chills, or sweats, no generalized fatigue, change in appetite Eyes: as above Ear, nose and throat: No hearing loss, ear pain, nasal congestion, sore throat Cardiovascular: as above Respiratory:  No shortness of breath at rest or with exertion, wheezes GastrointestinaI: No nausea, vomiting, diarrhea, abdominal pain, fecal incontinence Genitourinary:  No dysuria, urinary retention or frequency  Musculoskeletal:  No neck pain, back pain Integumentary: No rash, pruritus, skin lesions Neurological: as above Psychiatric: +depression, insomnia, anxiety Endocrine: No palpitations, fatigue, diaphoresis, mood swings, change in appetite, change in weight, increased thirst Hematologic/Lymphatic:  No anemia, purpura, petechiae. Allergic/Immunologic: no itchy/runny eyes, nasal congestion, recent allergic reactions, rashes  PHYSICAL EXAM: Filed Vitals:   10/12/13 0748  BP: 120/80  Pulse: 72  Temp: 98.2 F (36.8 C)   General: No acute distress Head:  Normocephalic/atraumatic Neck: supple, no paraspinal tenderness, full range of motion Back: No paraspinal tenderness Heart: regular rate and rhythm Lungs: Clear to auscultation bilaterally. Vascular: No carotid bruits. Skin/Extremities: No rash, no edema Neurological Exam: Mental status: alert and oriented to person, place, and time, no dysarthria or dysphagia. Fund of knowledge is appropriate.  Recent and remote memory intact.  Attention span and concentration normal.  Repeats and names without difficulty. Cranial nerves: CN I: not tested CN II: pupils equal, round and reactive to light, visual fields intact, fundi unremarkable. CN III, IV, VI:  full range of  motion, no nystagmus, no ptosis CN V: facial sensation intact CN VII: upper and lower face symmetric CN VIII: hearing intact CN IX, X: gag intact, uvula midline CN XI: sternocleidomastoid and trapezius muscles intact CN XII: tongue midline Bulk & Tone: normal, no fasciculations. Motor: 5/5 throughout with no pronator drift. Sensation: intact to light touch, cold, pin, vibration and joint position sense.  No extinction to double simultaneous stimulation.  Romberg test negative Deep Tendon Reflexes: +2 throughout, no clonus Plantar responses: downgoing bilaterally Finger to nose testing: none Gait: narrow-based and steady, able to tandem walk adequately.  IMPRESSION: This is a pleasant 38 year old right-handed man with a history of hypertension, hyperlipidemia, and depression, presenting with new onset seizure last 09/29/2013 where his wife witnessed an episode of generalized shaking lasting 8-10 minutes.  A few days prior to this, he had an episode of loss of consciousness in the bathroom.  No clear epilepsy risk factors.  His neurological exam is normal.  MRI brain with and without contrast showed normal variant of enlarged perivascular spaces in the left thalamus. He continues to feel off, with lightheadedness and vision changes.  A routine EEG will be ordered to further classify his seizures, if normal, a 24-hour EEG will be done.  I am concerned about the possibility of non-epileptic seizures particularly with symptoms starting after his father passed away and significant stressors that the patient endorsed.  Continue Keppra 500mg  BID for now.  We discussed different causes of seizures and he expressed understanding.  [place] driving laws were discussed with the patient, and he knows to stop driving after a seizure, until 6 months seizure-free.   Thank you for allowing me to participate in the care of this patient. Please do not hesitate to call for any questions or concerns.   Ellouise Newer,  M.D.  CC: Dr. Antonietta Jewel

## 2013-10-14 ENCOUNTER — Ambulatory Visit: Payer: BC Managed Care – PPO | Admitting: Neurology

## 2013-10-18 ENCOUNTER — Ambulatory Visit (INDEPENDENT_AMBULATORY_CARE_PROVIDER_SITE_OTHER): Payer: BC Managed Care – PPO | Admitting: Neurology

## 2013-10-18 DIAGNOSIS — F411 Generalized anxiety disorder: Secondary | ICD-10-CM

## 2013-10-18 DIAGNOSIS — R569 Unspecified convulsions: Secondary | ICD-10-CM

## 2013-10-20 NOTE — Procedures (Signed)
ELECTROENCEPHALOGRAM REPORT  Date of Study: 10/18/2013  Patient's Name: [name] MRN: [id number] Date of Birth: October 01, 1975  Referring Provider: Dr. Ellouise Newer  Clinical History: This is a 38 year old man with new onset seizure last 09/29/2013 where his wife witnessed an episode of generalized shaking lasting 8-10 minutes. A few days prior to this, he had an episode of loss of consciousness in the bathroom  Medications: Xanax, Mevacor, nortriptyline, atenolol  Technical Summary: A multichannel digital EEG recording measured by the international 10-20 system with electrodes applied with paste and impedances below 5000 ohms performed in our laboratory with EKG monitoring in an awake and asleep patient.  Hyperventilation and photic stimulation were performed.  The digital EEG was referentially recorded, reformatted, and digitally filtered in a variety of bipolar and referential montages for optimal display.  Spike detection software was employed.  Description: The patient is awake and asleep during the recording.  During maximal wakefulness, there is a symmetric, medium voltage 9 Hz posterior dominant rhythm that attenuates with eye opening.  The record is symmetric.  During drowsiness and stage I sleep, there is an increase in theta slowing of the background with occasional vertex waves seen.  Hyperventilation and photic stimulation did not elicit any abnormalities.  There were no epileptiform discharges or electrographic seizures seen.    EKG lead was unremarkable.  Impression: This awake and asleep EEG is normal.    Clinical Correlation: A normal EEG does not exclude a clinical diagnosis of epilepsy. If further clinical questions remain, prolonged EEG may be helpful.  Clinical correlation is advised.   Ellouise Newer, M.D.

## 2013-10-21 NOTE — Progress Notes (Signed)
Patient came in for EEG. See Procedure notes for EEG results.  

## 2013-11-10 IMAGING — US US RENAL
1 series · 14 of 25 positions shown · non-contrast
Comparison: 11/01/2012.

CLINICAL DATA: Left flank pain.

RENAL/URINARY TRACT ULTRASOUND COMPLETE

[Series 1: us renal · 0.33mm/px · 14 of 30 slices shown]
[im 1/30]
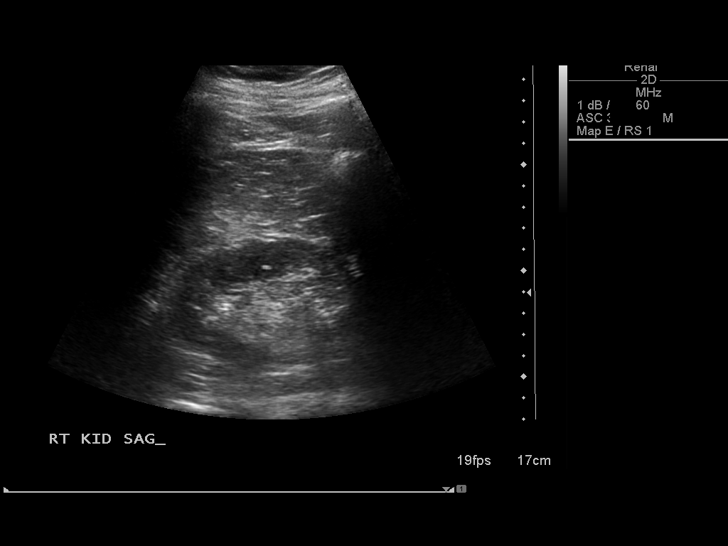
[im 3/30]
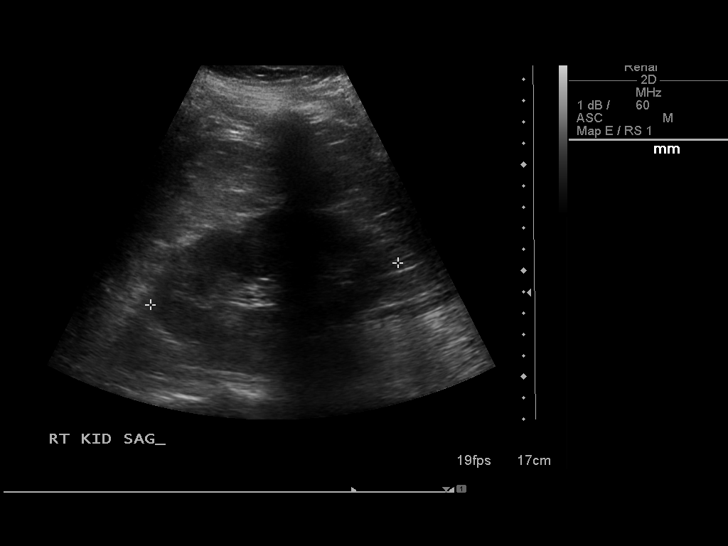
[im 5/30]
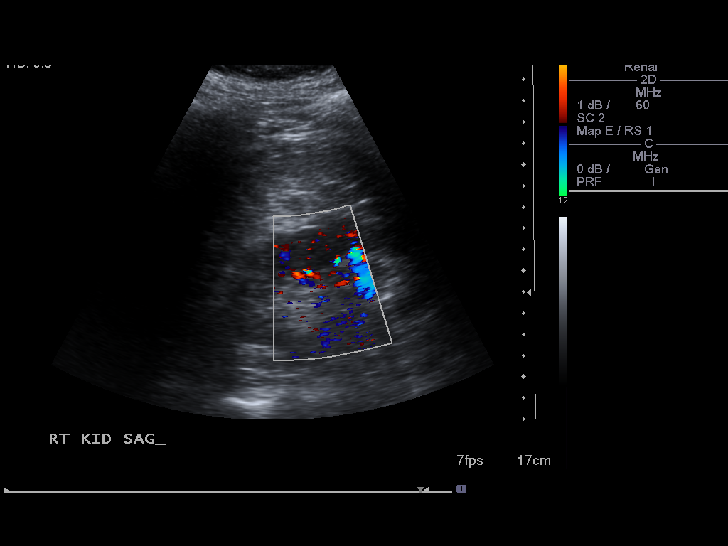
[im 8/30]
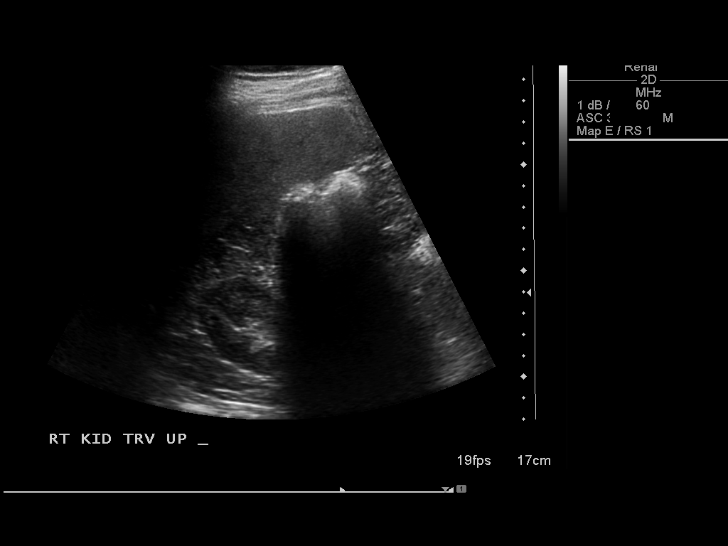
[im 10/30]
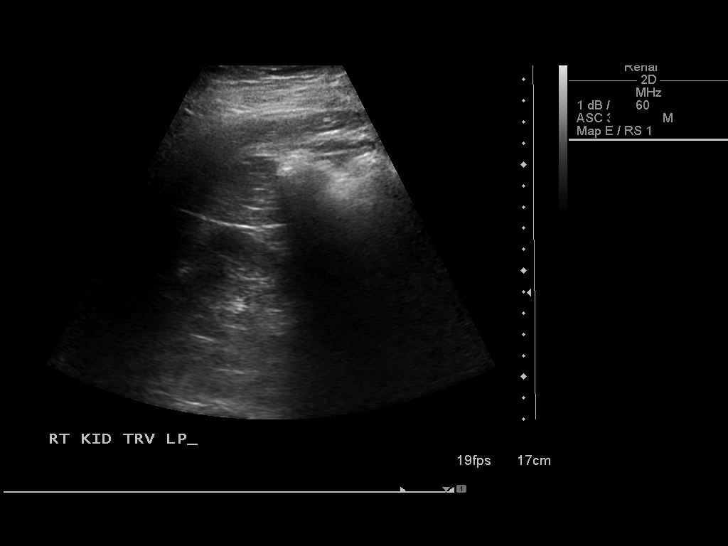
[im 11/30]
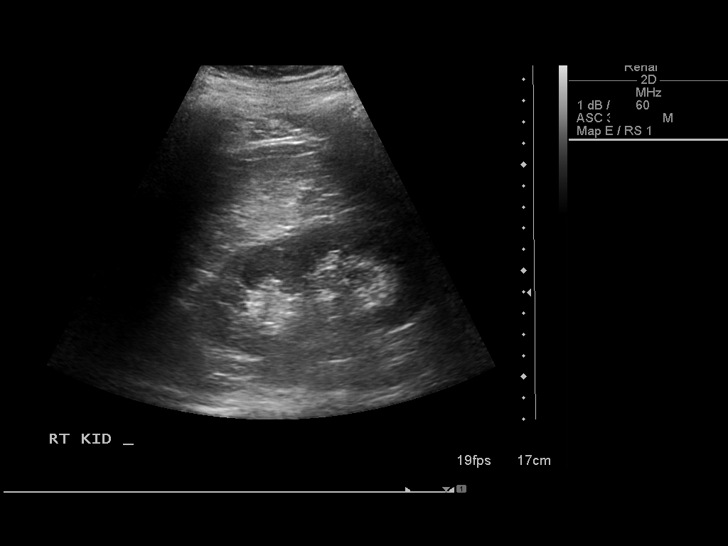
[im 14/30]
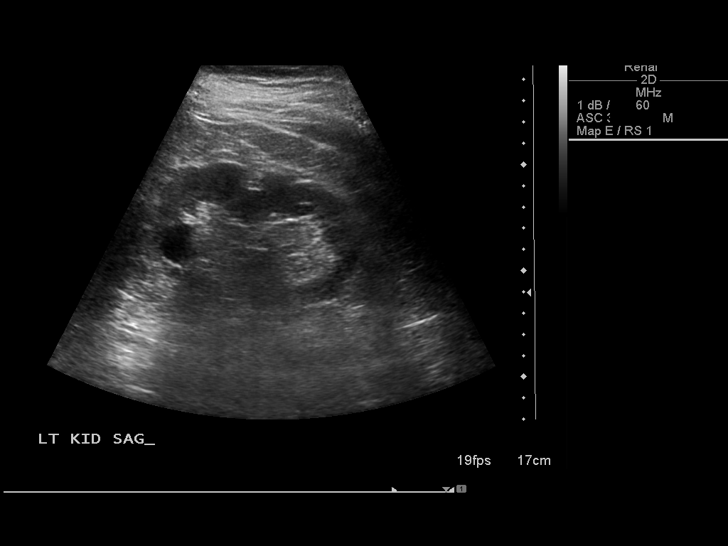
[im 16/30]
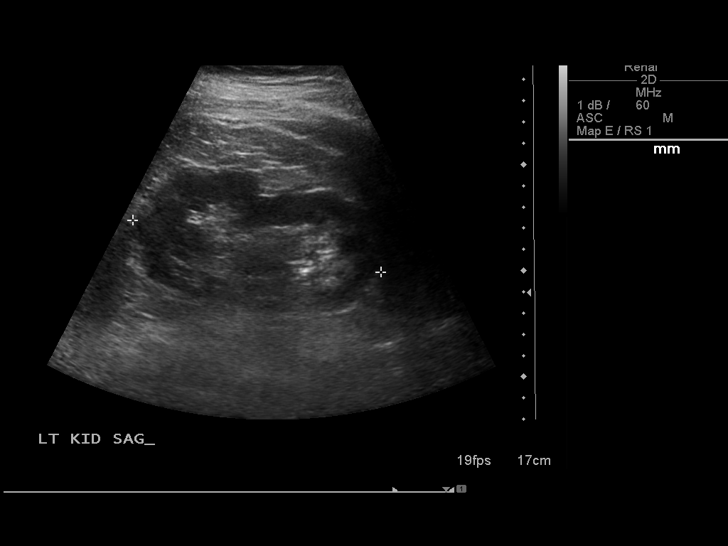
[im 19/30]
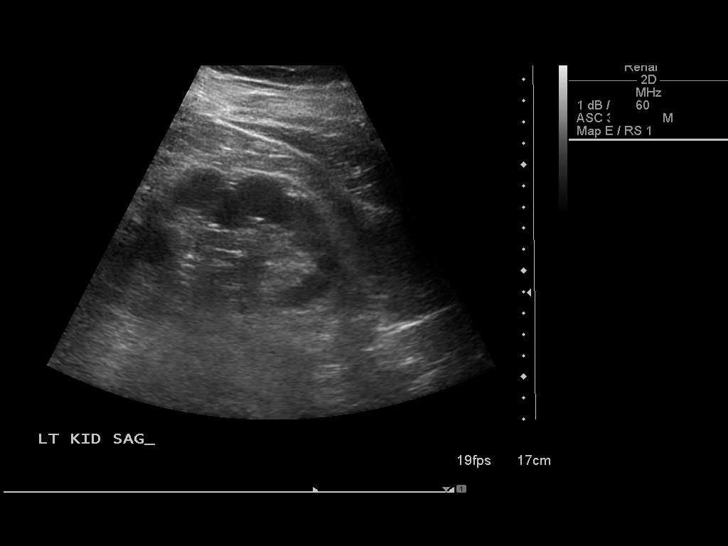
[im 20/30]
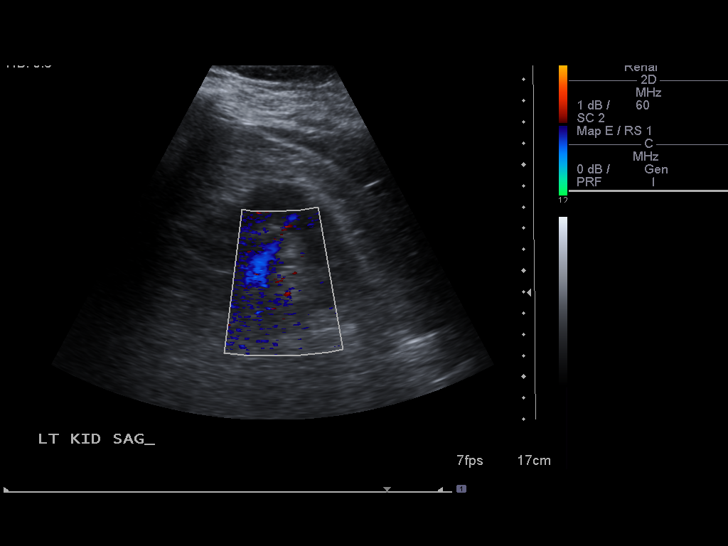
[im 22/30]
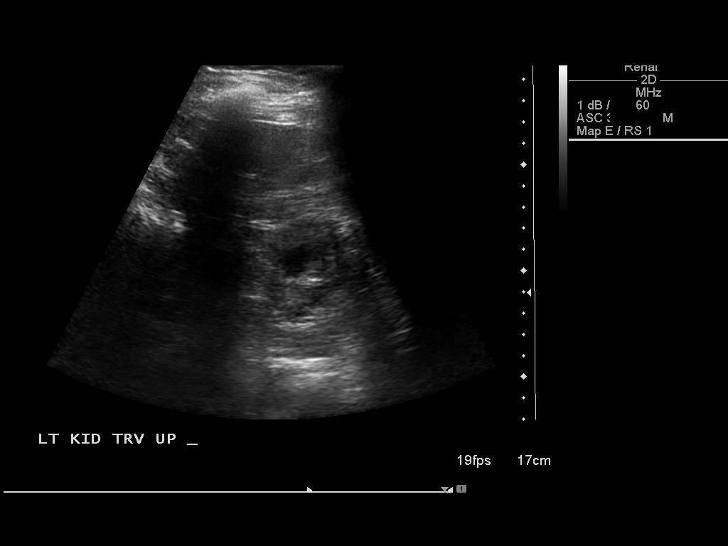
[im 25/30]
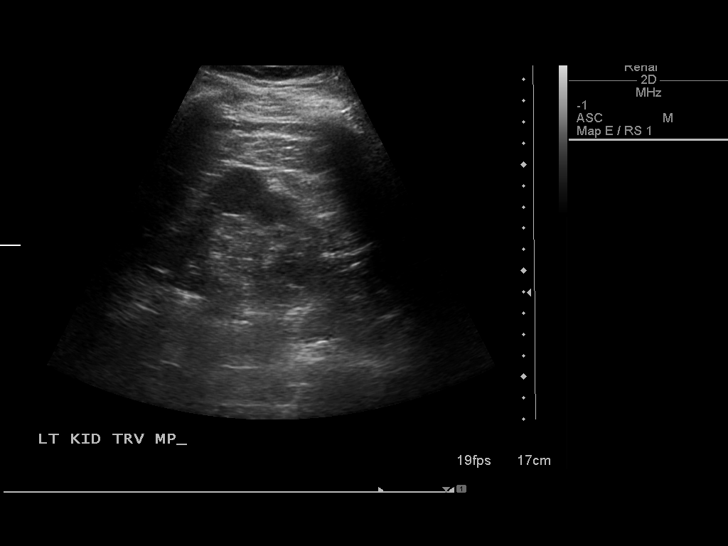
[im 27/30]
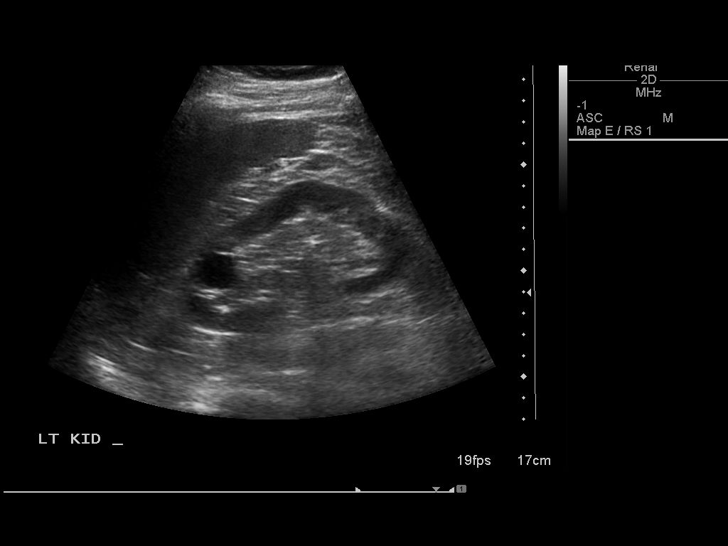
[im 30/30]
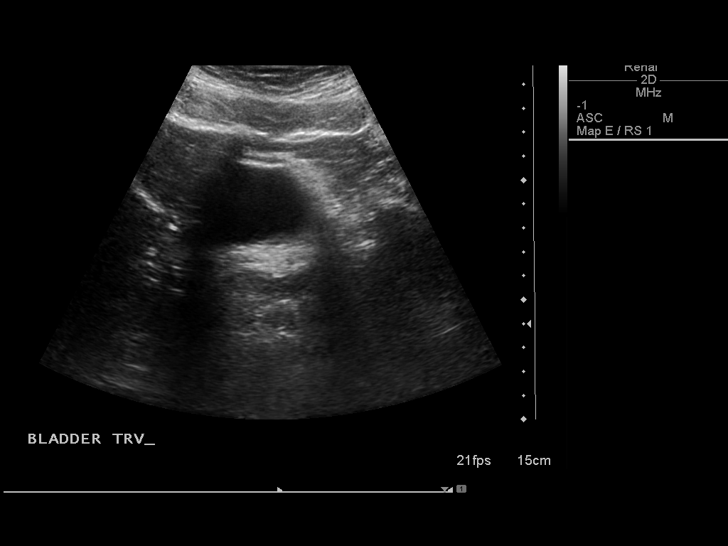

[14 of 25 positions shown; findings below may reference images not displayed]

FINDINGS: Right Kidney:  11.8 cm in length.  Normal renal cortical thickness
and echogenicity.  No hydronephrosis.

Left Kidney:  11.9 cm in length.  Normal renal cortical thickness
and echogenicity. There is a 2.2 x 2.1 x 2.0 cm upper pole cyst.  A
1 cm shadowing mid pole calculus is noted.

Bladder:  Normal.
IMPRESSION: Left renal cyst and renal calculus but no hydronephrosis.

## 2014-08-22 ENCOUNTER — Encounter (HOSPITAL_COMMUNITY): Payer: Self-pay

## 2014-08-22 ENCOUNTER — Emergency Department (HOSPITAL_COMMUNITY)
Admission: EM | Admit: 2014-08-22 | Discharge: 2014-08-22 | Disposition: A | Discharge status: Home / Self Care | Payer: BLUE CROSS/BLUE SHIELD | Attending: Emergency Medicine | Admitting: Emergency Medicine

## 2014-08-22 DIAGNOSIS — R51 Headache: Secondary | ICD-10-CM | POA: Insufficient documentation

## 2014-08-22 DIAGNOSIS — R5383 Other fatigue: Secondary | ICD-10-CM | POA: Insufficient documentation

## 2014-08-22 DIAGNOSIS — D72829 Elevated white blood cell count, unspecified: Secondary | ICD-10-CM | POA: Diagnosis not present

## 2014-08-22 DIAGNOSIS — R7989 Other specified abnormal findings of blood chemistry: Secondary | ICD-10-CM | POA: Insufficient documentation

## 2014-08-22 DIAGNOSIS — Z9049 Acquired absence of other specified parts of digestive tract: Secondary | ICD-10-CM | POA: Diagnosis not present

## 2014-08-22 DIAGNOSIS — Z792 Long term (current) use of antibiotics: Secondary | ICD-10-CM | POA: Insufficient documentation

## 2014-08-22 DIAGNOSIS — K219 Gastro-esophageal reflux disease without esophagitis: Secondary | ICD-10-CM | POA: Insufficient documentation

## 2014-08-22 DIAGNOSIS — Z87442 Personal history of urinary calculi: Secondary | ICD-10-CM | POA: Diagnosis not present

## 2014-08-22 DIAGNOSIS — J45909 Unspecified asthma, uncomplicated: Secondary | ICD-10-CM | POA: Diagnosis not present

## 2014-08-22 DIAGNOSIS — R630 Anorexia: Secondary | ICD-10-CM | POA: Insufficient documentation

## 2014-08-22 DIAGNOSIS — R112 Nausea with vomiting, unspecified: Secondary | ICD-10-CM | POA: Diagnosis not present

## 2014-08-22 DIAGNOSIS — Z720 Tobacco use: Secondary | ICD-10-CM | POA: Diagnosis not present

## 2014-08-22 DIAGNOSIS — Z79899 Other long term (current) drug therapy: Secondary | ICD-10-CM | POA: Insufficient documentation

## 2014-08-22 DIAGNOSIS — R109 Unspecified abdominal pain: Secondary | ICD-10-CM | POA: Diagnosis present

## 2014-08-22 DIAGNOSIS — F419 Anxiety disorder, unspecified: Secondary | ICD-10-CM | POA: Insufficient documentation

## 2014-08-22 DIAGNOSIS — Z8739 Personal history of other diseases of the musculoskeletal system and connective tissue: Secondary | ICD-10-CM | POA: Diagnosis not present

## 2014-08-22 DIAGNOSIS — R197 Diarrhea, unspecified: Secondary | ICD-10-CM | POA: Diagnosis not present

## 2014-08-22 DIAGNOSIS — R6883 Chills (without fever): Secondary | ICD-10-CM | POA: Diagnosis not present

## 2014-08-22 LAB — CBC
HEMATOCRIT: 45 % (ref 39.0–52.0)
HEMOGLOBIN: 15.1 g/dL (ref 13.0–17.0)
MCH: 31 pg (ref 26.0–34.0)
MCHC: 33.6 g/dL (ref 30.0–36.0)
MCV: 92.4 fL (ref 78.0–100.0)
Platelets: 257 10*3/uL (ref 150–400)
RBC: 4.87 MIL/uL (ref 4.22–5.81)
RDW: 13.3 % (ref 11.5–15.5)
WBC: 14.6 10*3/uL — ABNORMAL HIGH (ref 4.0–10.5)

## 2014-08-22 LAB — COMPREHENSIVE METABOLIC PANEL
ALBUMIN: 3.5 g/dL (ref 3.5–5.2)
ALT: 24 U/L (ref 0–53)
AST: 16 U/L (ref 0–37)
Alkaline Phosphatase: 74 U/L (ref 39–117)
Anion gap: 6 (ref 5–15)
BUN: 15 mg/dL (ref 6–23)
CO2: 28 mmol/L (ref 19–32)
CREATININE: 1.34 mg/dL (ref 0.50–1.35)
Calcium: 9.3 mg/dL (ref 8.4–10.5)
Chloride: 106 mmol/L (ref 96–112)
GFR calc Af Amer: 77 mL/min — ABNORMAL LOW (ref >90)
GFR, EST NON AFRICAN AMERICAN: 66 mL/min — AB (ref >90)
Glucose, Bld: 93 mg/dL (ref 70–99)
Potassium: 3.9 mmol/L (ref 3.5–5.1)
SODIUM: 140 mmol/L (ref 135–145)
Total Bilirubin: 0.4 mg/dL (ref 0.3–1.2)
Total Protein: 6.1 g/dL (ref 6.0–8.3)

## 2014-08-22 LAB — LIPASE, BLOOD: LIPASE: 28 U/L (ref 11–59)

## 2014-08-22 MED ORDER — DICYCLOMINE HCL 10 MG PO CAPS
20.0000 mg | ORAL_CAPSULE | Freq: Once | ORAL | Status: AC
  Administered 2014-08-22: 20 mg via ORAL
  Filled 2014-08-22: qty 2

## 2014-08-22 MED ORDER — DICYCLOMINE HCL 20 MG PO TABS: 20.0000 mg | ORAL_TABLET | Freq: Two times a day (BID) | ORAL | Status: DC

## 2014-08-22 MED ORDER — SODIUM CHLORIDE 0.9 % IV BOLUS (SEPSIS)
1000.0000 mL | Freq: Once | INTRAVENOUS | Status: AC
  Administered 2014-08-22: 1000 mL via INTRAVENOUS

## 2014-08-22 MED ORDER — ONDANSETRON 4 MG PO TBDP: 4.0000 mg | ORAL_TABLET | Freq: Three times a day (TID) | ORAL | Status: DC | PRN

## 2014-08-22 MED ORDER — ONDANSETRON HCL 4 MG/2ML IJ SOLN
4.0000 mg | Freq: Once | INTRAMUSCULAR | Status: AC
  Administered 2014-08-22: 4 mg via INTRAVENOUS
  Filled 2014-08-22: qty 2

## 2014-08-22 NOTE — ED Provider Notes (Signed)
CSN: [id number]     Arrival date & time 08/22/14  0913 History   First MD Initiated Contact with Patient 08/22/14 [phone]     Chief Complaint  Patient presents with  . Abdominal Pain  . Nausea   [name] is a 39 y.o. male with a history of GERD, and kidney stones who presents the emergency department complaining of abdominal pain, nausea, vomiting and diarrhea ongoing for the past 3 days. The patient reports he believes he has food poisoning. The patient reports he and his son ate the same food on Saturday and approximately 30 minutes later they both became very sick. He reports his son has improved but he has continued having symptoms. He reports vomiting multiple times today he reports having diarrhea yesterday but none today. He reports subjective fever, headache and sharp, crampy intermittent midabdominal pain that he rates it a 5 out of 10. He reports his pain is worse with vomiting and better with sitting. His previous abdominal surgeries include an appendectomy and inguinal hernia repair. The patient reports he saw his primary care provider yesterday and is diagnosed with food poisoning and given a prescription for Zofran and Bactrim.  The patient denies dysuria, hematuria, urinary frequency, urinary urgency, hematochezia, hematemesis, cough, or rashes.   (Consider location/radiation/quality/duration/timing/severity/associated sxs/prior Treatment) HPI  Past Medical History  Diagnosis Date  . Asthma   . Kidney stones, mixed calcium oxalate   . GERD (gastroesophageal reflux disease)   . Bulging lumbar disc   . Anxiety   . Seizures   . Syncope and collapse   . XJOITGPQ(982.6)    Past Surgical History  Procedure Laterality Date  . Cystoscopy w/ ureteral stent placement    . Extracorporeal shock wave lithotripsy    . Hernia repair    . Cervical disc arthroplasty    . Tonsillectomy    . Appendectomy     No family history on file. History  Substance Use Topics  . Smoking status:  Current Every Day Smoker -- 0.50 packs/day    Types: Cigarettes  . Smokeless tobacco: Not on file  . Alcohol Use: No     Comment: social    Review of Systems  Constitutional: Positive for chills, appetite change and fatigue. Negative for fever.  HENT: Negative for congestion, sore throat and trouble swallowing.   Eyes: Negative for visual disturbance.  Respiratory: Negative for cough, shortness of breath and wheezing.   Cardiovascular: Negative for chest pain and palpitations.  Gastrointestinal: Positive for nausea, vomiting, abdominal pain and diarrhea. Negative for blood in stool.  Genitourinary: Negative for dysuria, urgency, frequency, hematuria, flank pain and difficulty urinating.  Musculoskeletal: Negative for myalgias, back pain, arthralgias and neck pain.  Skin: Negative for rash.  Neurological: Positive for headaches. Negative for light-headedness.      Allergies  Hydrocodone  Home Medications   Prior to Admission medications   Medication Sig Start Date End Date Taking? Authorizing Provider  albuterol (PROVENTIL HFA;VENTOLIN HFA) 108 (90 BASE) MCG/ACT inhaler Inhale 2 puffs into the lungs every 4 (four) hours as needed for shortness of breath. For shortness of breath   Yes Historical Provider, MD  ALPRAZolam Duanne Moron) 0.5 MG tablet Take 0.5 mg by mouth 2 (two) times daily as needed for anxiety.   Yes Historical Provider, MD  ALPRAZolam Duanne Moron) 1 MG tablet Take 1 mg by mouth daily as needed for anxiety.  08/03/14  Yes Historical Provider, MD  ondansetron (ZOFRAN) 4 MG tablet Take 4 mg by mouth  3 (three) times daily as needed for nausea or vomiting.  08/21/14  Yes Historical Provider, MD  oxyCODONE-acetaminophen (PERCOCET/ROXICET) 5-325 MG per tablet Take 1 tablet by mouth every 6 (six) hours as needed. 08/03/14  Yes Historical Provider, MD  pantoprazole (PROTONIX) 40 MG tablet Take 40 mg by mouth daily.   Yes Historical Provider, MD  sulfamethoxazole-trimethoprim (BACTRIM  DS,SEPTRA DS) 800-160 MG per tablet Take 1 tablet by mouth 2 (two) times daily. 08/21/14  Yes Historical Provider, MD  dicyclomine (BENTYL) 20 MG tablet Take 1 tablet (20 mg total) by mouth 2 (two) times daily. 08/22/14   Waynetta Pean, PA-C  levETIRAcetam (KEPPRA) 500 MG tablet Take 1 tablet (500 mg total) by mouth 2 (two) times daily. Patient not taking: Reported on 08/22/2014 10/12/13   Cameron Sprang, MD  ondansetron (ZOFRAN ODT) 4 MG disintegrating tablet Take 1 tablet (4 mg total) by mouth every 8 (eight) hours as needed for nausea or vomiting. 08/22/14   Waynetta Pean, PA-C   BP 110/67 mmHg  Pulse 63  Temp(Src) 98.1 F (36.7 C) (Oral)  Resp 16  Ht 6\' 1"  (1.854 m)  Wt 237 lb (107.502 kg)  BMI 31.27 kg/m2  SpO2 96% Physical Exam  Constitutional: He appears well-developed and well-nourished. No distress.  Nontoxic appearing.  HENT:  Head: Normocephalic and atraumatic.  Mouth/Throat: Oropharynx is clear and moist. No oropharyngeal exudate.  Eyes: Conjunctivae are normal. Pupils are equal, round, and reactive to light. Right eye exhibits no discharge. Left eye exhibits no discharge.  Neck: Normal range of motion. Neck supple. No JVD present.  Cardiovascular: Normal rate, regular rhythm, normal heart sounds and intact distal pulses.  Exam reveals no gallop and no friction rub.   No murmur heard. Pulmonary/Chest: Effort normal and breath sounds normal. No respiratory distress. He has no wheezes. He has no rales.  Abdominal: Soft. Bowel sounds are normal. He exhibits no distension and no mass. There is tenderness. There is no rebound and no guarding.  Abdomen soft. Bowel sounds are present. Patient has a mild midabdominal and epigastric tenderness to palpation. No right lower quadrant tenderness. Negative psoas and obturator sign.   Musculoskeletal: He exhibits no edema.  Lymphadenopathy:    He has no cervical adenopathy.  Neurological: He is alert. Coordination normal.  Skin: Skin is warm  and dry. No rash noted. He is not diaphoretic. No erythema. No pallor.  Psychiatric: He has a normal mood and affect. His behavior is normal.  Nursing note and vitals reviewed.   ED Course  Procedures (including critical care time) Labs Review Labs Reviewed  CBC - Abnormal; Notable for the following:    WBC 14.6 (*)    All other components within normal limits  COMPREHENSIVE METABOLIC PANEL - Abnormal; Notable for the following:    GFR calc non Af Amer 66 (*)    GFR calc Af Amer 77 (*)    All other components within normal limits  LIPASE, BLOOD  CBC WITH DIFFERENTIAL/PLATELET  COMPREHENSIVE METABOLIC PANEL  LIPASE, BLOOD    Imaging Review No results found.   EKG Interpretation None      Filed Vitals:   08/22/14 1130 08/22/14 1200 08/22/14 1230 08/22/14 1300  BP: 112/72 115/71 109/71 110/67  Pulse: 75 59 61 63  Temp:      TempSrc:      Resp: 18 18 16 16   Height:      Weight:      SpO2: 98% 98% 100% 96%  MDM   Meds given in ED:  Medications  sodium chloride 0.9 % bolus 1,000 mL (0 mLs Intravenous Stopped 08/22/14 1205)  ondansetron (ZOFRAN) injection 4 mg (4 mg Intravenous Given 08/22/14 1020)  dicyclomine (BENTYL) capsule 20 mg (20 mg Oral Given 08/22/14 1214)    Discharge Medication List as of 08/22/2014  2:27 PM    START taking these medications   Details  dicyclomine (BENTYL) 20 MG tablet Take 1 tablet (20 mg total) by mouth 2 (two) times daily., Starting 08/22/2014, Until Discontinued, Print    ondansetron (ZOFRAN ODT) 4 MG disintegrating tablet Take 1 tablet (4 mg total) by mouth every 8 (eight) hours as needed for nausea or vomiting., Starting 08/22/2014, Until Discontinued, Print        Final diagnoses:  Nausea vomiting and diarrhea   This is a 39 y.o. male with a history of GERD, and kidney stones who presents the emergency department complaining of abdominal pain, nausea, vomiting and diarrhea ongoing for the past 3 days. The patient reports he  believes he has food poisoning. The patient reports he and his son ate the same food on Saturday and approximately 30 minutes later they both became very sick. He reports his son has improved but he has continued having symptoms. The patient is afebrile and nontoxic appearing. He has mild midabdominal and epigastric tenderness to palpation. No peritoneal signs. Patient has a mild leukocytosis with a white count of 14.6. His CBC is otherwise unremarkable. Patient's CMP indicates creatinine of 1.34 with a GFR of 66. The patient has had decreased kidney function previously. His CMP is otherwise unremarkable. His lipase is normal at 28. Ad reevaluation the patient reports feeling improved but having some stomach grumbling. Will give Bentyl and reassess.  At second reevaluation the patient reports feeling better and feels ready to be discharged. He reports feeling hungry. The patient has tolerated water and bentyl by mouth prior to discharge. He reports feeling ready to be discharged. Patient's symptoms are likely due to viral gastroneuritis or food poisoning. Based on exam and lab work I do not feel imaging is needed at this time. Educated patient on symptom management treatment of his nausea and vomiting. Advised to review to a bland diet initially. Patient provided with prescriptions for Zofran ODT and Bentyl for use at home. Advised patient is to follow-up with his primary care provider to reevaluate his kidney function upon improvement. I advised the patient to follow-up with their primary care provider this week. I advised the patient to return to the emergency department with new or worsening symptoms or new concerns. The patient verbalized understanding and agreement with plan.   This patient was discussed with Dr. Sabra Heck who agrees with assessment and plan.     Waynetta Pean, PA-C 08/22/14 [place], MD 08/22/14 [phone]

## 2014-08-22 NOTE — ED Notes (Signed)
Spoke with phlebotomy who drew blood.  Stated called lab and they stated they were delayed and have to catch up.

## 2014-08-22 NOTE — ED Notes (Signed)
Spoke with Provider regarding pain medication for patient.

## 2014-08-22 NOTE — ED Notes (Signed)
Provider at bedside

## 2014-08-22 NOTE — Discharge Instructions (Signed)
Nausea and Vomiting °Nausea is a sick feeling that often comes before throwing up (vomiting). Vomiting is a reflex where stomach contents come out of your mouth. Vomiting can cause severe loss of body fluids (dehydration). Children and elderly adults can become dehydrated quickly, especially if they also have diarrhea. Nausea and vomiting are symptoms of a condition or disease. It is important to find the cause of your symptoms. °CAUSES  °· Direct irritation of the stomach lining. This irritation can result from increased acid production (gastroesophageal reflux disease), infection, food poisoning, taking certain medicines (such as nonsteroidal anti-inflammatory drugs), alcohol use, or tobacco use. °· Signals from the brain. These signals could be caused by a headache, heat exposure, an inner ear disturbance, increased pressure in the brain from injury, infection, a tumor, or a concussion, pain, emotional stimulus, or metabolic problems. °· An obstruction in the gastrointestinal tract (bowel obstruction). °· Illnesses such as diabetes, hepatitis, gallbladder problems, appendicitis, kidney problems, cancer, sepsis, atypical symptoms of a heart attack, or eating disorders. °· Medical treatments such as chemotherapy and radiation. °· Receiving medicine that makes you sleep (general anesthetic) during surgery. °DIAGNOSIS °Your caregiver may ask for tests to be done if the problems do not improve after a few days. Tests may also be done if symptoms are severe or if the reason for the nausea and vomiting is not clear. Tests may include: °· Urine tests. °· Blood tests. °· Stool tests. °· Cultures (to look for evidence of infection). °· X-rays or other imaging studies. °Test results can help your caregiver make decisions about treatment or the need for additional tests. °TREATMENT °You need to stay well hydrated. Drink frequently but in small amounts. You may wish to drink water, sports drinks, clear broth, or eat frozen  ice pops or gelatin dessert to help stay hydrated. When you eat, eating slowly may help prevent nausea. There are also some antinausea medicines that may help prevent nausea. °HOME CARE INSTRUCTIONS  °· Take all medicine as directed by your caregiver. °· If you do not have an appetite, do not force yourself to eat. However, you must continue to drink fluids. °· If you have an appetite, eat a normal diet unless your caregiver tells you differently. °¨ Eat a variety of complex carbohydrates (rice, wheat, potatoes, bread), lean meats, yogurt, fruits, and vegetables. °¨ Avoid high-fat foods because they are more difficult to digest. °· Drink enough water and fluids to keep your urine clear or pale yellow. °· If you are dehydrated, ask your caregiver for specific rehydration instructions. Signs of dehydration may include: °¨ Severe thirst. °¨ Dry lips and mouth. °¨ Dizziness. °¨ Dark urine. °¨ Decreasing urine frequency and amount. °¨ Confusion. °¨ Rapid breathing or pulse. °SEEK IMMEDIATE MEDICAL CARE IF:  °· You have blood or brown flecks (like coffee grounds) in your vomit. °· You have black or bloody stools. °· You have a severe headache or stiff neck. °· You are confused. °· You have severe abdominal pain. °· You have chest pain or trouble breathing. °· You do not urinate at least once every 8 hours. °· You develop cold or clammy skin. °· You continue to vomit for longer than 24 to 48 hours. °· You have a fever. °MAKE SURE YOU:  °· Understand these instructions. °· Will watch your condition. °· Will get help right away if you are not doing well or get worse. °Document Released: 06/02/2005 Document Revised: 08/25/2011 Document Reviewed: 10/30/2010 °[employer]® Patient Information ©2015 [employer]. This information is not intended   to replace advice given to you by your health care provider. Make sure you discuss any questions you have with your health care provider. Food Poisoning Food poisoning is an illness  caused by something you ate or drank. There are over 250 known causes of food poisoning. However, many other causes are unknown.You can be treated even if the exact cause of your food poisoning is not known. In most cases, food poisoning is mild and lasts 1 to 2 days. However, some cases can be serious, especially for people with low immune systems, the elderly, children and infants, and pregnant women. CAUSES  Poor personal hygiene, improper cleaning of storage and preparation areas, and unclean utensils can cause infection or tainting (contamination) of foods. The causes of food poisoning are numerous.Infectious agents, such as viruses, bacteria, or parasites, can cause harm by infecting the intestine and disrupting the absorption of nutrients and water. This can cause diarrhea and lead to dehydration. Viruses are responsible for most of the food poisonings in which an agent is found. Parasites are less likely to cause food poisoning. Toxic agents, such as poisonous mushrooms, marine algae, and pesticides can also cause food poisoning.  Viral causes of food poisoning include:  Norovirus.  Rotavirus.  Hepatitis A.  Bacterial causes of food poisoning include:  Salmonellae.  Campylobacter.  Bacillus cereus.  Escherichia coli (E. coli).  Shigella.  Listeria monocytogenes.  Clostridium botulinum (botulism).  Vibrio cholerae.  Parasites that can cause food poisoning include:  Giardia.  Cryptosporidium.  Toxoplasma. SYMPTOMS Symptoms may appear several hours or longer after consuming the contaminated food or drink. Symptoms may include:  Nausea.  Vomiting.  Cramping.  Diarrhea.  Fever and chills.  Muscle aches. DIAGNOSIS Your health care provider may be able to diagnose food poisoning from a list of what you have recently eaten and results from lab tests. Diagnostic tests may include an exam of the feces. TREATMENT In most cases, treatment focuses on helping to  relieve your symptoms and staying well hydrated. Antibiotic medicines are rarely needed. In severe cases, hospitalization may be required. HOME CARE INSTRUCTIONS   Drink enough water and fluids to keep your urine clear or pale yellow. Drink small amounts of fluids frequently and increase as tolerated.  Ask your health care provider for specific rehydration instructions.  Avoid:  Foods high in sugar.  Alcohol.  Carbonated drinks.  Tobacco.  Juice.  Caffeine drinks.  Extremely hot or cold fluids.  Fatty, greasy foods.  Too much intake of anything at one time.  Dairy products until 24 to 48 hours after diarrhea stops.  You may consume probiotics. Probiotics are active cultures of beneficial bacteria. They may lessen the amount and number of diarrheal stools in adults. Probiotics can be found in yogurt with active cultures and in supplements.  Wash your hands well to avoid spreading the bacteria.  Take medicines only as directed by your health care provider. Do not give your child aspirin because of the association with Reye's syndrome.  Ask your health care provider if you should continue to take your regular prescribed and over-the-counter medicines. PREVENTION   Wash your hands, food preparation surfaces, and utensils thoroughly before and after handling raw foods.  Keep refrigerated foods below 61F (5C).  Serve hot foods immediately or keep them heated above 161F (60C).  Divide large volumes of food into small portions for rapid cooling in the refrigerator. Hot, bulky foods in the refrigerator can raise the temperature of other foods that have already  cooled.  Follow approved canning procedures.  Heat canned foods thoroughly before tasting.  When in doubt, throw it out.  Infants, the elderly, women who are pregnant, and people with compromised immune systems are especially susceptible to food poisoning. These people should never consume unpasteurized cheese,  unpasteurized cider, raw fish, raw seafood, or raw meat-type products. SEEK IMMEDIATE MEDICAL CARE IF:   You have difficulty breathing, swallowing, talking, or moving.  You develop blurred vision.  You are unable to keep fluids down.  You faint or nearly faint.  Your eyes turn yellow.  Vomiting or diarrhea develops or becomes persistent.  Abdominal pain develops, increases, or localizes in one small area.  You have a fever.  The diarrhea becomes excessive or contains blood or mucus.  You develop excessive weakness, dizziness, or extreme thirst.  You have no urine for 8 hours. MAKE SURE YOU:   Understand these instructions.  Will watch your condition.  Will get help right away if you are not doing well or get worse. Document Released: 02/29/2004 Document Revised: 10/17/2013 Document Reviewed: 10/17/2010 [facility] Patient Information 2015 [place], [place]. This information is not intended to replace advice given to you by your health care provider. Make sure you discuss any questions you have with your health care provider.

## 2014-08-22 NOTE — ED Notes (Signed)
Lab results on down time. Called lab for results and stated does not have blood to run test.

## 2014-08-22 NOTE — ED Notes (Signed)
Pt here for abd pain, and nausea since Saturday. Thinks he might have food poisoning. Also has a sore throat from dry heaving. Had diarrhea from Saturday until today with the vomiting.

## 2014-10-23 ENCOUNTER — Ambulatory Visit (INDEPENDENT_AMBULATORY_CARE_PROVIDER_SITE_OTHER): Payer: BLUE CROSS/BLUE SHIELD

## 2014-10-23 ENCOUNTER — Ambulatory Visit (INDEPENDENT_AMBULATORY_CARE_PROVIDER_SITE_OTHER): Payer: BLUE CROSS/BLUE SHIELD | Admitting: Family Medicine

## 2014-10-23 VITALS — BP 124/88 | HR 77 | Temp 97.9°F | Resp 18 | Ht 73.0 in | Wt 239.0 lb

## 2014-10-23 DIAGNOSIS — M549 Dorsalgia, unspecified: Secondary | ICD-10-CM

## 2014-10-23 DIAGNOSIS — K529 Noninfective gastroenteritis and colitis, unspecified: Secondary | ICD-10-CM | POA: Diagnosis not present

## 2014-10-23 DIAGNOSIS — G8929 Other chronic pain: Secondary | ICD-10-CM

## 2014-10-23 DIAGNOSIS — R1013 Epigastric pain: Secondary | ICD-10-CM

## 2014-10-23 DIAGNOSIS — F411 Generalized anxiety disorder: Secondary | ICD-10-CM | POA: Diagnosis not present

## 2014-10-23 LAB — COMPREHENSIVE METABOLIC PANEL
ALBUMIN: 4.3 g/dL (ref 3.5–5.2)
ALT: 26 U/L (ref 0–53)
AST: 17 U/L (ref 0–37)
Alkaline Phosphatase: 79 U/L (ref 39–117)
BUN: 15 mg/dL (ref 6–23)
CALCIUM: 9.5 mg/dL (ref 8.4–10.5)
CO2: 27 meq/L (ref 19–32)
CREATININE: 1.13 mg/dL (ref 0.50–1.35)
Chloride: 104 mEq/L (ref 96–112)
GLUCOSE: 86 mg/dL (ref 70–99)
Potassium: 4.2 mEq/L (ref 3.5–5.3)
Sodium: 139 mEq/L (ref 135–145)
Total Bilirubin: 0.5 mg/dL (ref 0.2–1.2)
Total Protein: 6.9 g/dL (ref 6.0–8.3)

## 2014-10-23 LAB — POCT CBC
GRANULOCYTE PERCENT: 70.5 % (ref 37–80)
HCT, POC: 48.1 % (ref 43.5–53.7)
Hemoglobin: 15.8 g/dL (ref 14.1–18.1)
Lymph, poc: 2.8 (ref 0.6–3.4)
MCH: 29.7 pg (ref 27–31.2)
MCHC: 32.8 g/dL (ref 31.8–35.4)
MCV: 90.4 fL (ref 80–97)
MID (cbc): 0.5 (ref 0–0.9)
MPV: 7.2 fL (ref 0–99.8)
PLATELET COUNT, POC: 298 10*3/uL (ref 142–424)
POC Granulocyte: 8 — AB (ref 2–6.9)
POC LYMPH PERCENT: 24.9 %L (ref 10–50)
POC MID %: 4.6 % (ref 0–12)
RBC: 5.32 M/uL (ref 4.69–6.13)
RDW, POC: 13.6 %
WBC: 11.3 10*3/uL — AB (ref 4.6–10.2)

## 2014-10-23 LAB — LIPASE: Lipase: 17 U/L (ref 0–75)

## 2014-10-23 MED ORDER — ALPRAZOLAM 1 MG PO TABS: 1.0000 mg | ORAL_TABLET | Freq: Two times a day (BID) | ORAL | Status: DC | PRN

## 2014-10-23 MED ORDER — GI COCKTAIL ~~LOC~~
30.0000 mL | Freq: Once | ORAL | Status: AC
  Administered 2014-10-23: 30 mL via ORAL

## 2014-10-23 MED ORDER — SUCRALFATE 1 G PO TABS: 1.0000 g | ORAL_TABLET | Freq: Three times a day (TID) | ORAL | Status: DC

## 2014-10-23 NOTE — Progress Notes (Signed)
[facility] [address], [place] [address] [phone]  Date:  10/23/2014   Name:  [name]   DOB:  09-29-1975   MRN:  [id number]  PCP:  Antonietta Jewel, MD    Chief Complaint: Abdominal Pain   History of Present Illness:  [name] is a 39 y.o. very pleasant male patient who presents with the following:  He is here today with complaint of "severe stomach pain.  It's like a bubble in my intestines, it will wake me up at night.'  He first had this on Friday- today is Monday and the pain occurred again last night.  This does not seem like his kidney stone pain.  It feels "like an empty, hungry feeling in my stomach." He did have nausea and vomited yesterday- ok since then.   He has not eaten yet today- he did ok when he ate last night He tends to have GI upset with drinking milk but he avoids milk in general He has been dx with GERD, possible IBS.  He has been told that he might have an ulcer- never had colonoscopy or endoscopy.   He does not really have a PCP now No blood in his stools.  He did have black stools last week but he had used pepto  The pain on Friday also came on in the night- he was able to sit on the toilet and push out the stool and gas until he felt better.   "I haven't had a sbolid stool in years."  He has never seen GI He is on chronic pain medication for his back- " I have 2 bulging discs in my back and I've had 2 disc decompressions in my neck."  He also takes chronic benzos for anxiety.   He has been taken off his seizure medications- he had a few days of seizures last April but was allowed to stop them.  No recurrence of his seizures.     Patient Active Problem List   Diagnosis Date Noted  . Seizures 10/12/2013  . Anxiety state, unspecified 10/12/2013    Past Medical History  Diagnosis Date  . Asthma   . Kidney stones, mixed calcium oxalate   . GERD (gastroesophageal reflux disease)   . Bulging lumbar disc   . Anxiety   .  Seizures   . Syncope and collapse   . Headache(784.0)   . Allergy     Past Surgical History  Procedure Laterality Date  . Cystoscopy w/ ureteral stent placement    . Extracorporeal shock wave lithotripsy    . Hernia repair    . Cervical disc arthroplasty    . Tonsillectomy    . Appendectomy      History  Substance Use Topics  . Smoking status: Current Every Day Smoker -- 0.50 packs/day    Types: Cigarettes  . Smokeless tobacco: Not on file  . Alcohol Use: No     Comment: social    Family History  Problem Relation Age of Onset  . Hyperlipidemia Mother     Allergies  Allergen Reactions  . Hydrocodone Itching and Nausea Only    Medication list has been reviewed and updated.  Current Outpatient Prescriptions on File Prior to Visit  Medication Sig Dispense Refill  . albuterol (PROVENTIL HFA;VENTOLIN HFA) 108 (90 BASE) MCG/ACT inhaler Inhale 2 puffs into the lungs every 4 (four) hours as needed for shortness of breath. For shortness of breath    .  ALPRAZolam (XANAX) 0.5 MG tablet Take 0.5 mg by mouth 2 (two) times daily as needed for anxiety.    . ondansetron (ZOFRAN) 4 MG tablet Take 4 mg by mouth 3 (three) times daily as needed for nausea or vomiting.   0  . oxyCODONE-acetaminophen (PERCOCET/ROXICET) 5-325 MG per tablet Take 1 tablet by mouth every 6 (six) hours as needed.  0  . pantoprazole (PROTONIX) 40 MG tablet Take 40 mg by mouth daily.    Marland Kitchen ALPRAZolam (XANAX) 1 MG tablet Take 1 mg by mouth daily as needed for anxiety.   0  . dicyclomine (BENTYL) 20 MG tablet Take 1 tablet (20 mg total) by mouth 2 (two) times daily. (Patient not taking: Reported on 10/23/2014) 20 tablet 0  . levETIRAcetam (KEPPRA) 500 MG tablet Take 1 tablet (500 mg total) by mouth 2 (two) times daily. (Patient not taking: Reported on 08/22/2014) 60 tablet 4  . sulfamethoxazole-trimethoprim (BACTRIM DS,SEPTRA DS) 800-160 MG per tablet Take 1 tablet by mouth 2 (two) times daily.  0   No current  facility-administered medications on file prior to visit.    Review of Systems:  As per HPI- otherwise negative.   Physical Examination: Filed Vitals:   10/23/14 1147  BP: 124/88  Pulse: 77  Temp: 97.9 F (36.6 C)  Resp: 18   Filed Vitals:   10/23/14 1147  Height: 6\' 1"  (1.854 m)  Weight: 239 lb (108.41 kg)   Body mass index is 31.54 kg/(m^2). Ideal Body Weight: Weight in (lb) to have BMI = 25: 189.1  GEN: WDWN, NAD, Non-toxic, A & O x 3, obese, looks well HEENT: Atraumatic, Normocephalic. Neck supple. No masses, No LAD. Ears and Nose: No external deformity. CV: RRR, No M/G/R. No JVD. No thrill. No extra heart sounds. PULM: CTA B, no wheezes, crackles, rhonchi. No retractions. No resp. distress. No accessory muscle use. ABD: S, ND, +BS. No rebound. No HSM.  Epigastric TTP EXTR: No c/c/e NEURO Normal gait.  PSYCH: Normally interactive. Conversant. Not depressed or anxious appearing.  Calm demeanor.   Reviewed his controlled substance database- he received alprazolam 1mg , #60 which is a 30 days supply on 09/22/14  GI cocktail- did relieve his epigastric pain  [facility] reading (PRIMARY) by  Dr. Lorelei Pont. Abd series: non specific bowel gas pattern  DG ABDOMEN ACUTE W/ 1V CHEST  COMPARISON: None.  FINDINGS: There is no evidence of dilated bowel loops or free intraperitoneal air. No radiopaque calculi or other significant radiographic abnormality is seen. Heart size and mediastinal contours are within normal limits. Both lungs are clear.  IMPRESSION: Negative abdominal radiographs. No acute cardiopulmonary disease.   Results for orders placed or performed in visit on 10/23/14  POCT CBC  Result Value Ref Range   WBC 11.3 (A) 4.6 - 10.2 K/uL   Lymph, poc 2.8 0.6 - 3.4   POC LYMPH PERCENT 24.9 10 - 50 %L   MID (cbc) 0.5 0 - 0.9   POC MID % 4.6 0 - 12 %M   POC Granulocyte 8.0 (A) 2 - 6.9   Granulocyte percent 70.5 37 - 80 %G   RBC 5.32 4.69 - 6.13 M/uL   Hemoglobin  15.8 14.1 - 18.1 g/dL   HCT, POC 48.1 43.5 - 53.7 %   MCV 90.4 80 - 97 fL   MCH, POC 29.7 27 - 31.2 pg   MCHC 32.8 31.8 - 35.4 g/dL   RDW, POC 13.6 %   Platelet Count, POC 298 142 -  424 K/uL   MPV 7.2 0 - 99.8 fL   He is s/p appendectomy  Assessment and Plan: Abdominal pain, epigastric - Plan: POCT CBC, Comprehensive metabolic panel, Lipase, DG Abd Acute W/Chest, GI COCKTAIL UP TO 45 CC, Ambulatory referral to Gastroenterology, gi cocktail (Maalox,Lidocaine,Donnatal), sucralfate (CARAFATE) 1 G tablet  Chronic diarrhea - Plan: Ambulatory referral to Gastroenterology  GAD (generalized anxiety disorder) - Plan: ALPRAZolam (XANAX) 1 MG tablet  Chronic back pain - Plan: Ambulatory referral to Pain Clinic  Likely gastritis/ possible ulcer.  Add carafate, continue his PPI Referral to GI for further care.  Follow-up if getting worse; will be in touch with him pending labs Refilled his xanax as he does not have a PCP at this time Referral to pain management to manage his chronic back pain  Signed Lamar Blinks, MD

## 2014-10-23 NOTE — Patient Instructions (Signed)
Add the carafate to your regimen for your stomach and I will refer you to a GI doctor asap If you are getting worse please let us know!

## 2014-10-24 ENCOUNTER — Encounter: Payer: Self-pay | Admitting: Physician Assistant

## 2014-11-10 ENCOUNTER — Encounter: Payer: Self-pay | Admitting: Physician Assistant

## 2014-11-10 ENCOUNTER — Ambulatory Visit (INDEPENDENT_AMBULATORY_CARE_PROVIDER_SITE_OTHER): Payer: BLUE CROSS/BLUE SHIELD | Admitting: Physician Assistant

## 2014-11-10 ENCOUNTER — Telehealth: Payer: Self-pay | Admitting: *Deleted

## 2014-11-10 ENCOUNTER — Other Ambulatory Visit (INDEPENDENT_AMBULATORY_CARE_PROVIDER_SITE_OTHER): Payer: BLUE CROSS/BLUE SHIELD

## 2014-11-10 VITALS — BP 130/68 | HR 76 | Ht 71.5 in | Wt 239.0 lb

## 2014-11-10 DIAGNOSIS — R109 Unspecified abdominal pain: Secondary | ICD-10-CM

## 2014-11-10 DIAGNOSIS — E739 Lactose intolerance, unspecified: Secondary | ICD-10-CM | POA: Diagnosis not present

## 2014-11-10 DIAGNOSIS — M549 Dorsalgia, unspecified: Secondary | ICD-10-CM

## 2014-11-10 DIAGNOSIS — R1013 Epigastric pain: Secondary | ICD-10-CM

## 2014-11-10 DIAGNOSIS — G8929 Other chronic pain: Secondary | ICD-10-CM

## 2014-11-10 DIAGNOSIS — R197 Diarrhea, unspecified: Secondary | ICD-10-CM | POA: Diagnosis not present

## 2014-11-10 LAB — HIGH SENSITIVITY CRP: CRP HIGH SENSITIVITY: 10.86 mg/L — AB (ref 0.000–5.000)

## 2014-11-10 LAB — SEDIMENTATION RATE: SED RATE: 5 mm/h (ref 0–22)

## 2014-11-10 MED ORDER — MOVIPREP 100 G PO SOLR: 1.0000 | ORAL | Status: DC

## 2014-11-10 MED ORDER — DICYCLOMINE HCL 10 MG PO CAPS: ORAL_CAPSULE | ORAL | Status: DC

## 2014-11-10 MED ORDER — PANTOPRAZOLE SODIUM 40 MG PO TBEC: 40.0000 mg | DELAYED_RELEASE_TABLET | Freq: Every day | ORAL | Status: DC

## 2014-11-10 NOTE — Patient Instructions (Addendum)
Please go to the basement level to have your labs drawn.  You have been scheduled for an endoscopy and colonoscopy. Please follow the written instructions given to you at your visit today. Please pick up your prep supplies at the pharmacy within the next 1-3 days. If you use inhalers (even only as needed), please bring them with you on the day of your procedure. Your physician has requested that you go to www.startemmi.com and enter the access code given to you at your visit today. This web site gives a general overview about your procedure. However, you should still follow specific instructions given to you by our office regarding your preparation for the procedure.           Normal BMI (Body Mass Index- based on height and weight) is between 19 and 25. Your BMI today is Body mass index is 32.87 kg/(m^2). Marland Kitchen Please consider follow up  regarding your BMI with your Primary Care Provider.

## 2014-11-10 NOTE — Telephone Encounter (Signed)
Got a fax from [facility] for a Prior authorization for the [place]. I called the patient to advise we dont do the PA for preps.  I asked what he would have to pay for the Moviprep and he just said it was too expensive and asked if there could be an alternative.  I told him I would put a Suprep at our front desk for him with new instructions.  He was thankful and explained, " the prep just isn't in his budget. He said he knows it is just a one time expense but it was too much."
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
## 2014-11-14 ENCOUNTER — Telehealth: Payer: Self-pay | Admitting: Gastroenterology

## 2014-11-14 LAB — CELIAC PANEL 10
Endomysial Screen: NEGATIVE
Gliadin IgA: 7 Units (ref <20)
Gliadin IgG: 2 Units (ref <20)
IgA: 213 mg/dL (ref 68–379)
TISSUE TRANSGLUT AB: 2 U/mL (ref <6)
Tissue Transglutaminase Ab, IgA: 1 U/mL (ref <4)

## 2014-11-14 NOTE — Progress Notes (Signed)
I agree with the note, plan above 

## 2014-11-14 NOTE — Telephone Encounter (Signed)
Spoke to [employer], the patient's wife. He was called today regarding the cancellation list and we were able to move his double procedure from 7-29-to 12-20-14.  They were glad about that. She did say his epigastric pain is bothering him. Amy told him to try to aviod NSAIDS/ Ibuprofen.  I told her to still have him take the Bentyl 10 mg 3 times daily and at bedtime if he needs it.  I told her that we will keep checking the cancellation list for a sooner appt again and we will call if we can get something in June.  ( She mentioned he took Vicodin from the Urgent care but that was for his back) She asked if we would prescribe that for his abd pain?  Amy said she would not prescribe that for his abdominal pain. I let Crystal know that.

## 2014-11-14 NOTE — Telephone Encounter (Signed)
Note from 11/10/14 states Suprep sample was left up front for pt with new directions. Pam please advise.

## 2014-11-15 ENCOUNTER — Telehealth: Payer: Self-pay | Admitting: Gastroenterology

## 2014-11-15 ENCOUNTER — Other Ambulatory Visit: Payer: Self-pay | Admitting: Family Medicine

## 2014-11-15 NOTE — Telephone Encounter (Signed)
PT AWARE THAT HE NEEDS TO KEEP APPT AS SCHEDULED AND CONTINUE BENTYL AND AVOID NSAIDS.  PT AGREED AND WILL CALL BACK WITH FURTHER CONCERNS

## 2014-11-19 ENCOUNTER — Telehealth: Payer: Self-pay | Admitting: Family Medicine

## 2014-11-19 NOTE — Telephone Encounter (Signed)
Patient request refill on alprazolam. Please advise

## 2014-11-19 NOTE — Telephone Encounter (Signed)
I did call the pharmacy on 6/1 and authorized an rx to be filled on the 7th of this month as I gve him #60 (he takes BID) on 5/9.  Called and let him know that this rx is there- he had not heard from [facility] and was not aware.  He will call them to make sure they have the rx, if not he will let me know

## 2014-12-20 ENCOUNTER — Ambulatory Visit (AMBULATORY_SURGERY_CENTER): Payer: BLUE CROSS/BLUE SHIELD | Admitting: Gastroenterology

## 2014-12-20 ENCOUNTER — Encounter: Payer: Self-pay | Admitting: Gastroenterology

## 2014-12-20 VITALS — BP 120/81 | HR 58 | Temp 97.3°F | Resp 30 | Ht 71.0 in | Wt 239.0 lb

## 2014-12-20 DIAGNOSIS — R197 Diarrhea, unspecified: Secondary | ICD-10-CM | POA: Diagnosis not present

## 2014-12-20 DIAGNOSIS — R1013 Epigastric pain: Secondary | ICD-10-CM

## 2014-12-20 DIAGNOSIS — K2990 Gastroduodenitis, unspecified, without bleeding: Secondary | ICD-10-CM | POA: Diagnosis not present

## 2014-12-20 DIAGNOSIS — D123 Benign neoplasm of transverse colon: Secondary | ICD-10-CM

## 2014-12-20 DIAGNOSIS — K2970 Gastritis, unspecified, without bleeding: Secondary | ICD-10-CM | POA: Diagnosis not present

## 2014-12-20 MED ORDER — PANTOPRAZOLE SODIUM 40 MG PO TBEC
40.0000 mg | DELAYED_RELEASE_TABLET | Freq: Two times a day (BID) | ORAL | Status: DC
Start: 2014-12-20 — End: 2016-01-07

## 2014-12-20 MED ORDER — SODIUM CHLORIDE 0.9 % IV SOLN: 500.0000 mL | INTRAVENOUS | Status: DC

## 2014-12-20 NOTE — Progress Notes (Signed)
Called to room to assist during endoscopic procedure.  Patient ID and intended procedure confirmed with present staff. Received instructions for my participation in the procedure from the performing physician.  

## 2014-12-20 NOTE — Progress Notes (Signed)
No problems noted in the recovery room. maw 

## 2014-12-20 NOTE — Op Note (Signed)
[facility]  [address] [place], [address]   COLONOSCOPY PROCEDURE REPORT  PATIENT: [name], [name]  MR#: [id number] BIRTHDATE: [birth year] , 39  yrs. old GENDER: male ENDOSCOPIST: Milus Banister, MD PROCEDURE DATE:  12/20/2014 PROCEDURE:   Colonoscopy with biopsy and Colonoscopy with snare polypectomy First Screening Colonoscopy - Avg.  risk and is 50 yrs.  old or older - No.  Prior Negative Screening - Now for repeat screening. N/A  History of Adenoma - Now for follow-up colonoscopy & has been > or = to 3 yrs.  N/A  Polyps removed today? Yes ASA CLASS:   Class II INDICATIONS:chronic diarrhea, abdominal pain. MEDICATIONS: Monitored anesthesia care and Propofol 300 mg IV  DESCRIPTION OF PROCEDURE:   After the risks benefits and alternatives of the procedure were thoroughly explained, informed consent was obtained.  The digital rectal exam revealed no abnormalities of the rectum.   The LB [id number]  endoscope was introduced through the anus and advanced to the terminal ileum which was intubated for a short distance. No adverse events experienced.   The quality of the prep was excellent.  The instrument was then slowly withdrawn as the colon was fully examined. Estimated blood loss is zero unless otherwise noted in this procedure report.  COLON FINDINGS: Two polyps were found, removed and sent to pathology.  These were both sessile, located in transverse segment, 4-35mm across, removed with cold snare.  The terminal ileum was normal.  The right colon was normal, randomly biopsied.  The left colon was slightly granular appearing without any distinct transition point.  The left colon was also randomly biopsied.  The examination was otherwise normal.  Retroflexed views revealed no abnormalities. The time to cecum = 2.4 Withdrawal time = 8.9   The scope was withdrawn and the procedure completed. COMPLICATIONS: There were no immediate  complications.  ENDOSCOPIC IMPRESSION: Two polyps were found, removed and sent to pathology.  The terminal ileum was normal.  The right colon was normal, randomly biopsied. The left colon was slightly granular appearing without any distinct transition point.  The left colon was also randomly biopsied.  The examination was otherwise normal  RECOMMENDATIONS: 1. If the polyp(s) removed today are proven to be adenomatous (pre-cancerous) polyps, you will need a repeat colonoscopy in 5 years.  Otherwise you should continue to follow colorectal cancer screening guidelines for "routine risk" patients with colonoscopy in 10 years.  You will receive a letter within 1-2 weeks with the results of your biopsy as well as final recommendations.  Please call my office if you have not received a letter after 3 weeks. 2. Await final biopsies from the colon; for now please start daily scheduled imodium (one pill twice daily and only stop if you become constipated).  eSigned:  Milus Banister, MD 12/20/2014 3:09 PM

## 2014-12-20 NOTE — Op Note (Signed)
[facility]  [address]. [place], [address]   ENDOSCOPY PROCEDURE REPORT  PATIENT: Frederick, [name]  MR#: [id number] BIRTHDATE: March 25, 1976 , 39  yrs. old GENDER: male ENDOSCOPIST: Milus Banister, MD PROCEDURE DATE:  12/20/2014 PROCEDURE:  EGD w/ biopsy ASA CLASS:     Class II INDICATIONS:  abdominal pain. MEDICATIONS: Monitored anesthesia care and Propofol 150 mg IV TOPICAL ANESTHETIC: none  DESCRIPTION OF PROCEDURE: After the risks benefits and alternatives of the procedure were thoroughly explained, informed consent was obtained.  The LB [id number] endoscope was introduced through the mouth and advanced to the second portion of the duodenum , Without limitations.  The instrument was slowly withdrawn as the mucosa was fully examined.  There was mild to moderate distal gastritis and bulbar duodenitis. This was biopsied and sent to pathology.  The examination was otherwise normal.  Retroflexed views revealed no abnormalities. The scope was then withdrawn from the patient and the procedure completed. COMPLICATIONS: There were no immediate complications.  ENDOSCOPIC IMPRESSION: There was mild to moderate distal gastritis and bulbar duodenitis. This was biopsied and sent to pathology.  The examination was otherwise normal  RECOMMENDATIONS: Await final pathology.  Please start ranitidine 150mg  pill, one pill at bedtime every night for overnight acid control.  Continue your protonix daily in AM before breakfast.   eSigned:  Milus Banister, MD 12/20/2014 3:23 PM

## 2014-12-20 NOTE — Patient Instructions (Addendum)
YOU HAD AN ENDOSCOPIC PROCEDURE TODAY AT THE [facility]:   Refer to the procedure report that was given to you for any specific questions about what was found during the examination.  If the procedure report does not answer your questions, please call your gastroenterologist to clarify.  If you requested that your care partner not be given the details of your procedure findings, then the procedure report has been included in a sealed envelope for you to review at your convenience later.  YOU SHOULD EXPECT: Some feelings of bloating in the abdomen. Passage of more gas than usual.  Walking can help get rid of the air that was put into your GI tract during the procedure and reduce the bloating. If you had a lower endoscopy (such as a colonoscopy or flexible sigmoidoscopy) you may notice spotting of blood in your stool or on the toilet paper. If you underwent a bowel prep for your procedure, you may not have a normal bowel movement for a few days.  Please Note:  You might notice some irritation and congestion in your nose or some drainage.  This is from the oxygen used during your procedure.  There is no need for concern and it should clear up in a day or so.  SYMPTOMS TO REPORT IMMEDIATELY:   Following lower endoscopy (colonoscopy or flexible sigmoidoscopy):  Excessive amounts of blood in the stool  Significant tenderness or worsening of abdominal pains  Swelling of the abdomen that is new, acute  Fever of 100F or higher   Following upper endoscopy (EGD)  Vomiting of blood or coffee ground material  New chest pain or pain under the shoulder blades  Painful or persistently difficult swallowing  New shortness of breath  Fever of 100F or higher  Black, tarry-looking stools  For urgent or emergent issues, a gastroenterologist can be reached at any hour by calling [phone].   DIET: Your first meal following the procedure should be a small meal and then it is ok to progress to  your normal diet. Heavy or fried foods are harder to digest and may make you feel nauseous or bloated.  Likewise, meals heavy in dairy and vegetables can increase bloating.  Drink plenty of fluids but you should avoid alcoholic beverages for 24 hours.  ACTIVITY:  You should plan to take it easy for the rest of today and you should NOT DRIVE or use heavy machinery until tomorrow (because of the sedation medicines used during the test).    FOLLOW UP: Our staff will call the number listed on your records the next business day following your procedure to check on you and address any questions or concerns that you may have regarding the information given to you following your procedure. If we do not reach you, we will leave a message.  However, if you are feeling well and you are not experiencing any problems, there is no need to return our call.  We will assume that you have returned to your regular daily activities without incident.  If any biopsies were taken you will be contacted by phone or by letter within the next 1-3 weeks.  Please call us at [phone] if you have not heard about the biopsies in 3 weeks.    SIGNATURES/CONFIDENTIALITY: You and/or your care partner have signed paperwork which will be entered into your electronic medical record.  These signatures attest to the fact that that the information above on your After Visit Summary has been reviewed   and is understood.  Full responsibility of the confidentiality of this discharge information lies with you and/or your care-partner.    Handouts were given to your care partner on polyps and gastritis. You may resume your current medications today.  Per Dr. Ardis Hughs please start daily imodium, one pill twice daily and only stop if you become constipated. Await biopsy results.    Continue your protonix daily in am before breakfast. An at bedtime.  Rx was sent to [facility]. Await biopsies results. Please call if any questions or  concerns.

## 2014-12-20 NOTE — Progress Notes (Signed)
Patient awakening,vss,report to rn 

## 2014-12-20 NOTE — Progress Notes (Signed)
While going over discharge instructions with the and his mother, I explained Dr. Ardis Hughs recommended to take ranitidine 150 mg at bedtime.  The pt said he had a history of kidney failure and he said he knows he is not supposed to take ranitidine.  I called Dr. Ardis Hughs and he said okay to change tx.  To take the protonix before breakfast and at bedtime.  Rx was sent to [facility] and [facility].

## 2014-12-21 ENCOUNTER — Telehealth: Payer: Self-pay | Admitting: *Deleted

## 2014-12-21 NOTE — Telephone Encounter (Signed)
Number identifier, left message, follow-up  

## 2014-12-26 ENCOUNTER — Telehealth: Payer: Self-pay | Admitting: Gastroenterology

## 2014-12-26 NOTE — Telephone Encounter (Signed)
Pt has been notified that results are not yet available.

## 2014-12-28 ENCOUNTER — Encounter: Payer: Self-pay | Admitting: Gastroenterology

## 2015-01-01 ENCOUNTER — Telehealth: Payer: Self-pay | Admitting: Gastroenterology

## 2015-01-01 NOTE — Telephone Encounter (Signed)
I printed the path and had [name] review, she states there is nothing active on the report.  Dr Ardis Hughs to review when he is back in the office next week.  Pt aware and would also like Dr Ardis Hughs to be aware that he has had some BRB on occasion since procedure.  He is aware that Dr Ardis Hughs is out of the office until next week.

## 2015-01-08 NOTE — Telephone Encounter (Signed)
Please let him know he should have gotten a path letter by now,   Ask how he is doing on the daily imodium.

## 2015-01-08 NOTE — Telephone Encounter (Signed)
Pt continues to have cramping in the abdomen. He is having 2 loose stools daily.  No blood.  He wants to know what he is suppose to do about the colitis.  Please advise.

## 2015-01-08 NOTE — Telephone Encounter (Signed)
The biopsies proved he actually did not have any colitis.  Lets' try him on levbid .375mg  pill, one pill twice daily, disp 60 with 5 refills, rov in 2 months. This is for cramping and can also help loose stools.

## 2015-01-09 MED ORDER — HYOSCYAMINE SULFATE ER 0.375 MG PO TB12: 0.3750 mg | ORAL_TABLET | Freq: Two times a day (BID) | ORAL | Status: DC

## 2015-01-09 NOTE — Telephone Encounter (Signed)
Pt aware and med sent to the pharmacy.  He will call back to set up appt.

## 2015-01-12 ENCOUNTER — Encounter: Payer: BLUE CROSS/BLUE SHIELD | Admitting: Gastroenterology

## 2015-02-15 ENCOUNTER — Telehealth: Payer: Self-pay | Admitting: Gastroenterology

## 2015-02-15 NOTE — Telephone Encounter (Signed)
Pt has been given an appt with Cecille Rubin for tomorrow 245 pm or continued diarrhea and nocturnal stools.  Nausea and abd cramping.

## 2015-02-16 ENCOUNTER — Telehealth: Payer: Self-pay | Admitting: Gastroenterology

## 2015-02-16 ENCOUNTER — Ambulatory Visit: Payer: BLUE CROSS/BLUE SHIELD | Admitting: Physician Assistant

## 2015-02-16 NOTE — Telephone Encounter (Signed)
Left message for pt to call back  °

## 2015-02-20 NOTE — Telephone Encounter (Signed)
Left message on machine to call back  

## 2015-02-21 NOTE — Telephone Encounter (Signed)
He should continue the PM levbid and also add a scheduled bedtime imodium (OTC), one pill  Thanks

## 2015-02-21 NOTE — Telephone Encounter (Signed)
Called patient and msg left regarding recommendation from Dr. Ardis Hughs. Patient to call back with any question or concerns.

## 2015-02-21 NOTE — Telephone Encounter (Signed)
Spoke to patient and he states that Levbid is not helping him. He is still having to get up 4-5 times a night with cramps and diarrhea. Patient had an appt with Cecille Rubin on 02-16-15 but cancelled appt due to work. Patient was advised to make an appt to discuss symptoms. He states that he can not come in this week and he will be out of town all next week. He states he will call back to make a follow up appt. but wants to know what to do until his next appt.  Please advise

## 2015-03-11 ENCOUNTER — Ambulatory Visit (INDEPENDENT_AMBULATORY_CARE_PROVIDER_SITE_OTHER): Payer: BLUE CROSS/BLUE SHIELD | Admitting: Family Medicine

## 2015-03-11 VITALS — BP 110/78 | HR 66 | Temp 98.5°F | Resp 18 | Ht 73.0 in | Wt 226.0 lb

## 2015-03-11 DIAGNOSIS — R062 Wheezing: Secondary | ICD-10-CM | POA: Diagnosis not present

## 2015-03-11 DIAGNOSIS — D72829 Elevated white blood cell count, unspecified: Secondary | ICD-10-CM

## 2015-03-11 DIAGNOSIS — R05 Cough: Secondary | ICD-10-CM

## 2015-03-11 DIAGNOSIS — Z8669 Personal history of other diseases of the nervous system and sense organs: Secondary | ICD-10-CM

## 2015-03-11 DIAGNOSIS — R059 Cough, unspecified: Secondary | ICD-10-CM

## 2015-03-11 DIAGNOSIS — Z87898 Personal history of other specified conditions: Secondary | ICD-10-CM

## 2015-03-11 DIAGNOSIS — R251 Tremor, unspecified: Secondary | ICD-10-CM | POA: Diagnosis not present

## 2015-03-11 DIAGNOSIS — J069 Acute upper respiratory infection, unspecified: Secondary | ICD-10-CM

## 2015-03-11 LAB — POCT CBC
Granulocyte percent: 83.4 %G — AB (ref 37–80)
HCT, POC: 48.8 % (ref 43.5–53.7)
Hemoglobin: 15.5 g/dL (ref 14.1–18.1)
Lymph, poc: 1.7 (ref 0.6–3.4)
MCH: 28.6 pg (ref 27–31.2)
MCHC: 31.9 g/dL (ref 31.8–35.4)
MCV: 89.8 fL (ref 80–97)
MID (CBC): 0.2 (ref 0–0.9)
MPV: 7.3 fL (ref 0–99.8)
PLATELET COUNT, POC: 274 10*3/uL (ref 142–424)
POC Granulocyte: 9.6 — AB (ref 2–6.9)
POC LYMPH PERCENT: 15.2 %L (ref 10–50)
POC MID %: 1.4 %M (ref 0–12)
RBC: 5.43 M/uL (ref 4.69–6.13)
RDW, POC: 13.7 %
WBC: 11.5 10*3/uL — AB (ref 4.6–10.2)

## 2015-03-11 MED ORDER — ALBUTEROL SULFATE HFA 108 (90 BASE) MCG/ACT IN AERS: 1.0000 | INHALATION_SPRAY | RESPIRATORY_TRACT | Status: AC | PRN

## 2015-03-11 MED ORDER — AZITHROMYCIN 250 MG PO TABS
ORAL_TABLET | ORAL | Status: DC
Start: 2015-03-11 — End: 2015-09-28

## 2015-03-11 NOTE — Patient Instructions (Addendum)
You may have a viral upper respiratory infection,  Or early bronchitis that has triggered some of the wheezing with your previous asthma. You can use the albuterol inhaler up to every 4-6 hours as needed. If you do have to use this medication more than 2-3 times per day or persistently uses medicine more than 3 days in row, we may need to use a different medication. Saline nasal spray atleast 4 times per day, over the counter mucinex or mucinex DM is ok for cough as long as it does not increase wheezing.    your infection fighting cells were borderline elevated, so we can start azithromycin to cover for possible bacterial cause of your symptoms, including an early pneumonia as this can sometimes start this way. If you are worsening, return in the next 48-72 hours for repeat exam. Sooner or to emergency room if worse  Drink plenty of fluids, rest as needed. See information below on upper respiratory infection and cough. Return to the clinic or go to the nearest emergency room if any of your symptoms worsen or new symptoms occur.  Your shakiness may be due to your current infection, but if any seizure activity, need to proceed to the emergency room. I would also recommend you call your neurologist as it appears when seen last year they did want to follow back up with you and possible further testing. Call their office to determine if follow-up needed.  Upper Respiratory Infection, Adult An upper respiratory infection (URI) is also sometimes known as the common cold. The upper respiratory tract includes the nose, sinuses, throat, trachea, and bronchi. Bronchi are the airways leading to the lungs. Most people improve within 1 week, but symptoms can last up to 2 weeks. A residual cough may last even longer.  CAUSES Many different viruses can infect the tissues lining the upper respiratory tract. The tissues become irritated and inflamed and often become very moist. Mucus production is also common. A cold is  contagious. You can easily spread the virus to others by oral contact. This includes kissing, sharing a glass, coughing, or sneezing. Touching your mouth or nose and then touching a surface, which is then touched by another person, can also spread the virus. SYMPTOMS  Symptoms typically develop 1 to 3 days after you come in contact with a cold virus. Symptoms vary from person to person. They may include:  Runny nose.  Sneezing.  Nasal congestion.  Sinus irritation.  Sore throat.  Loss of voice (laryngitis).  Cough.  Fatigue.  Muscle aches.  Loss of appetite.  Headache.  Low-grade fever. DIAGNOSIS  You might diagnose your own cold based on familiar symptoms, since most people get a cold 2 to 3 times a year. Your caregiver can confirm this based on your exam. Most importantly, your caregiver can check that your symptoms are not due to another disease such as strep throat, sinusitis, pneumonia, asthma, or epiglottitis. Blood tests, throat tests, and X-rays are not necessary to diagnose a common cold, but they may sometimes be helpful in excluding other more serious diseases. Your caregiver will decide if any further tests are required. RISKS AND COMPLICATIONS  You may be at risk for a more severe case of the common cold if you smoke cigarettes, have chronic heart disease (such as heart failure) or lung disease (such as asthma), or if you have a weakened immune system. The very young and very old are also at risk for more serious infections. Bacterial sinusitis, middle ear infections,  and bacterial pneumonia can complicate the common cold. The common cold can worsen asthma and chronic obstructive pulmonary disease (COPD). Sometimes, these complications can require emergency medical care and may be life-threatening. PREVENTION  The best way to protect against getting a cold is to practice good hygiene. Avoid oral or hand contact with people with cold symptoms. Wash your hands often if  contact occurs. There is no clear evidence that vitamin C, vitamin E, echinacea, or exercise reduces the chance of developing a cold. However, it is always recommended to get plenty of rest and practice good nutrition. TREATMENT  Treatment is directed at relieving symptoms. There is no cure. Antibiotics are not effective, because the infection is caused by a virus, not by bacteria. Treatment may include:  Increased fluid intake. Sports drinks offer valuable electrolytes, sugars, and fluids.  Breathing heated mist or steam (vaporizer or shower).  Eating chicken soup or other clear broths, and maintaining good nutrition.  Getting plenty of rest.  Using gargles or lozenges for comfort.  Controlling fevers with ibuprofen or acetaminophen as directed by your caregiver.  Increasing usage of your inhaler if you have asthma. Zinc gel and zinc lozenges, taken in the first 24 hours of the common cold, can shorten the duration and lessen the severity of symptoms. Pain medicines may help with fever, muscle aches, and throat pain. A variety of non-prescription medicines are available to treat congestion and runny nose. Your caregiver can make recommendations and may suggest nasal or lung inhalers for other symptoms.  HOME CARE INSTRUCTIONS   Only take over-the-counter or prescription medicines for pain, discomfort, or fever as directed by your caregiver.  Use a warm mist humidifier or inhale steam from a shower to increase air moisture. This may keep secretions moist and make it easier to breathe.  Drink enough water and fluids to keep your urine clear or pale yellow.  Rest as needed.  Return to work when your temperature has returned to normal or as your caregiver advises. You may need to stay home longer to avoid infecting others. You can also use a face mask and careful hand washing to prevent spread of the virus. SEEK MEDICAL CARE IF:   After the first few days, you feel you are getting worse  rather than better.  You need your caregiver's advice about medicines to control symptoms.  You develop chills, worsening shortness of breath, or brown or red sputum. These may be signs of pneumonia.  You develop yellow or brown nasal discharge or pain in the face, especially when you bend forward. These may be signs of sinusitis.  You develop a fever, swollen neck glands, pain with swallowing, or white areas in the back of your throat. These may be signs of strep throat. SEEK IMMEDIATE MEDICAL CARE IF:   You have a fever.  You develop severe or persistent headache, ear pain, sinus pain, or chest pain.  You develop wheezing, a prolonged cough, cough up blood, or have a change in your usual mucus (if you have chronic lung disease).  You develop sore muscles or a stiff neck. Document Released: 11/26/2000 Document Revised: 08/25/2011 Document Reviewed: 09/07/2013 [facility] Patient Information 2015 [place], [place]. This information is not intended to replace advice given to you by your health care provider. Make sure you discuss any questions you have with your health care provider.   Bronchospasm A bronchospasm is when the tubes that carry air in and out of your lungs (airways) spasm or tighten. During a  bronchospasm it is hard to breathe. This is because the airways get smaller. A bronchospasm can be triggered by:  Allergies. These may be to animals, pollen, food, or mold.  Infection. This is a common cause of bronchospasm.  Exercise.  Irritants. These include pollution, cigarette smoke, strong odors, aerosol sprays, and paint fumes.  Weather changes.  Stress.  Being emotional. HOME CARE   Always have a plan for getting help. Know when to call your doctor and local emergency services (911 in the [place] Know where you can get emergency care.  Only take medicines as told by your doctor.  If you were prescribed an inhaler or nebulizer machine, ask your doctor how to use it  correctly. Always use a spacer with your inhaler if you were given one.  Stay calm during an attack. Try to relax and breathe more slowly.  Control your home environment:  Change your heating and air conditioning filter at least once a month.  Limit your use of fireplaces and wood stoves.  Do not  smoke. Do not  allow smoking in your home.  Avoid perfumes and fragrances.  Get rid of pests (such as roaches and mice) and their droppings.  Throw away plants if you see mold on them.  Keep your house clean and dust free.  Replace carpet with wood, tile, or vinyl flooring. Carpet can trap dander and dust.  Use allergy-proof pillows, mattress covers, and box spring covers.  Wash bed sheets and blankets every week in hot water. Dry them in a dryer.  Use blankets that are made of polyester or cotton.  Wash hands frequently. GET HELP IF:  You have muscle aches.  You have chest pain.  The thick spit you spit or cough up (sputum) changes from clear or white to yellow, green, gray, or bloody.  The thick spit you spit or cough up gets thicker.  There are problems that may be related to the medicine you are given such as:  A rash.  Itching.  Swelling.  Trouble breathing. GET HELP RIGHT AWAY IF:  You feel you cannot breathe or catch your breath.  You cannot stop coughing.  Your treatment is not helping you breathe better.  You have very bad chest pain. MAKE SURE YOU:   Understand these instructions.  Will watch your condition.  Will get help right away if you are not doing well or get worse. Document Released: 03/30/2009 Document Revised: 06/07/2013 Document Reviewed: 11/23/2012 [facility] Patient Information 2015 [place], [place]. This information is not intended to replace advice given to you by your health care provider. Make sure you discuss any questions you have with your health care provider.   Cough, Adult  A cough is a reflex that helps clear your throat and  airways. It can help heal the body or may be a reaction to an irritated airway. A cough may only last 2 or 3 weeks (acute) or may last more than 8 weeks (chronic).  CAUSES Acute cough:  Viral or bacterial infections. Chronic cough:  Infections.  Allergies.  Asthma.  Post-nasal drip.  Smoking.  Heartburn or acid reflux.  Some medicines.  Chronic lung problems (COPD).  Cancer. SYMPTOMS   Cough.  Fever.  Chest pain.  Increased breathing rate.  High-pitched whistling sound when breathing (wheezing).  Colored mucus that you cough up (sputum). TREATMENT   A bacterial cough may be treated with antibiotic medicine.  A viral cough must run its course and will not respond to antibiotics.  Your caregiver may recommend other treatments if you have a chronic cough. HOME CARE INSTRUCTIONS   Only take over-the-counter or prescription medicines for pain, discomfort, or fever as directed by your caregiver. Use cough suppressants only as directed by your caregiver.  Use a cold steam vaporizer or humidifier in your bedroom or home to help loosen secretions.  Sleep in a semi-upright position if your cough is worse at night.  Rest as needed.  Stop smoking if you smoke. SEEK IMMEDIATE MEDICAL CARE IF:   You have pus in your sputum.  Your cough starts to worsen.  You cannot control your cough with suppressants and are losing sleep.  You begin coughing up blood.  You have difficulty breathing.  You develop pain which is getting worse or is uncontrolled with medicine.  You have a fever. MAKE SURE YOU:   Understand these instructions.  Will watch your condition.  Will get help right away if you are not doing well or get worse. Document Released: 11/29/2010 Document Revised: 08/25/2011 Document Reviewed: 11/29/2010 [facility] Patient Information 2015 [place], [place]. This information is not intended to replace advice given to you by your health care provider. Make  sure you discuss any questions you have with your health care provider.

## 2015-03-11 NOTE — Progress Notes (Signed)
Subjective:  This chart was scribed for Frederick Ray, MD by [name], Medical Scribe. This patient was seen in room 2 and the patient's care was started 12:47 PM.    Patient ID: [name], male    DOB: Jan 27, 1976, 39 y.o.   MRN: [id number]  HPI [name] is a 39 y.o. male Pt states that he started feeling weak and congested 2 days ago. As the weekend progress, he felt more pressure in his head, shaking in his hands, and weakness. He denies having a seizure with the hands shaking. He's taken ibuprofen and sudafed. He had asthma when he was younger. He denies having an inhaler now. He hasn't smoked 2 days ago. He's quit once, and he's trying to get back into a program.   His last seizure was over a year ago. 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  Back in April, he was on kepra, and was stopped as long as seizures stopped.   He was recommended for a flu shot today but denies receiving one.   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   Patient Active Problem List   Diagnosis Date Noted  . Chronic back pain 11/10/2014  . Seizures 10/12/2013  . Anxiety state, unspecified 10/12/2013   Past Medical History  Diagnosis Date  . Asthma   . Kidney stones, mixed calcium oxalate   . GERD (gastroesophageal reflux disease)   . Bulging lumbar disc   . Anxiety   . Seizures   . Syncope and collapse   . Headache(784.0)   . Allergy    Past Surgical History  Procedure Laterality Date  . Cystoscopy w/ ureteral stent placement    . Extracorporeal shock wave lithotripsy    . Hernia repair    . Cervical disc arthroplasty    . Tonsillectomy    . Appendectomy    . Cholecystectomy     Allergies  Allergen Reactions  . Codeine Itching  . Hydrocodone Itching and Nausea Only   Prior to Admission medications   Medication Sig Start Date End Date Taking? Authorizing Provider  ALPRAZolam (XANAX) 1 MG tablet TAKE 1 TABLET BY MOUTH TWICE DAILY AS NEEDED  FOR ANXIETY 11/15/14  Yes Jessica C Copland, MD  pantoprazole (PROTONIX) 40 MG tablet Take 1 tablet (40 mg total) by mouth daily. 11/10/14  Yes Amy S Esterwood, PA-C  pantoprazole (PROTONIX) 40 MG tablet Take 1 tablet (40 mg total) by mouth 2 (two) times daily. Take 1 pill 20-30 minutes prior to breakfast and bedtime. 12/20/14  Yes Milus Banister, MD  albuterol (PROVENTIL HFA;VENTOLIN HFA) 108 (90 BASE) MCG/ACT inhaler Inhale 2 puffs into the lungs every 4 (four) hours as needed for shortness of breath. For shortness of breath    Historical Provider, MD  dicyclomine (BENTYL) 10 MG capsule Take 1 tab 30 min before meals and at bedtime. Patient not taking: Reported on 03/11/2015 11/10/14   Amy S Esterwood, PA-C  hyoscyamine (LEVBID) 0.375 MG 12 hr tablet Take 1 tablet (0.375 mg total) by mouth 2 (two) times daily. Patient not taking: Reported on 03/11/2015 01/09/15   Milus Banister, MD   Social History   Social History  . Marital Status: Married    Spouse Name: N/A  . Number of Children: N/A  . Years of Education: N/A   Occupational History  . Not on file.   Social History Main Topics  . Smoking status: Current Every Day Smoker --  0.50 packs/day    Types: Cigarettes  . Smokeless tobacco: Not on file  . Alcohol Use: No     Comment: social  . Drug Use: No  . Sexual Activity: Not on file   Other Topics Concern  . Not on file   Social History Narrative     Review of Systems  Constitutional: Positive for fever and fatigue.  HENT: Positive for congestion and sinus pressure.   Respiratory: Positive for cough, shortness of breath and wheezing.        Objective:   Physical Exam  Constitutional: He is oriented to person, place, and time. He appears well-developed and well-nourished.  HENT:  Head: Normocephalic and atraumatic.  Right Ear: Tympanic membrane, external ear and ear canal normal.  Left Ear: Tympanic membrane, external ear and ear canal normal.  Nose: No rhinorrhea.    Mouth/Throat: Oropharynx is clear and moist and mucous membranes are normal. No oropharyngeal exudate or posterior oropharyngeal erythema.  Sinus non tender Minimal erythema post oropharynx   Eyes: Conjunctivae are normal. Pupils are equal, round, and reactive to light.  Neck: Neck supple.  Cardiovascular: Normal rate, regular rhythm, normal heart sounds and intact distal pulses.   No murmur heard. Pulmonary/Chest: Effort normal. No respiratory distress. He has wheezes (faint expiratory). He has no rhonchi. He has no rales.  Abdominal: Soft. There is no tenderness.  Lymphadenopathy:    He has no cervical adenopathy.  Neurological: He is alert and oriented to person, place, and time.  Skin: Skin is warm and dry. No rash noted.  Psychiatric: He has a normal mood and affect. His behavior is normal.  Vitals reviewed.   Filed Vitals:   03/11/15 1231  BP: 110/78  Pulse: 66  Temp: 98.5 F (36.9 C)  TempSrc: Oral  Resp: 18  Height: 6\' 1"  (1.854 m)  Weight: 226 lb (102.513 kg)  SpO2: 99%   Results for orders placed or performed in visit on 03/11/15  POCT CBC  Result Value Ref Range   WBC 11.5 (A) 4.6 - 10.2 K/uL   Lymph, poc 1.7 0.6 - 3.4   POC LYMPH PERCENT 15.2 10 - 50 %L   MID (cbc) 0.2 0 - 0.9   POC MID % 1.4 0 - 12 %M   POC Granulocyte 9.6 (A) 2 - 6.9   Granulocyte percent 83.4 (A) 37 - 80 %G   RBC 5.43 4.69 - 6.13 M/uL   Hemoglobin 15.5 14.1 - 18.1 g/dL   HCT, POC 48.8 43.5 - 53.7 %   MCV 89.8 80 - 97 fL   MCH, POC 28.6 27 - 31.2 pg   MCHC 31.9 31.8 - 35.4 g/dL   RDW, POC 13.7 %   Platelet Count, POC 274 142 - 424 K/uL   MPV 7.3 0 - 99.8 fL       Assessment & Plan:   [name] is a 39 y.o. male Cough - Plan: POCT CBC, albuterol (PROVENTIL HFA;VENTOLIN HFA) 108 (90 BASE) MCG/ACT inhaler, azithromycin (ZITHROMAX) 250 MG tablet Acute upper respiratory infection - Plan: POCT CBC Wheezing - Plan: albuterol (PROVENTIL HFA;VENTOLIN HFA) 108 (90 BASE) MCG/ACT  inhaler, azithromycin (ZITHROMAX) 250 MG tablet Leukocytosis - Plan: azithromycin (ZITHROMAX) 250 MG tablet   -symptoms suspected to be initial viral upper respiratory infection, now with worsening sinus pressure and chest congestion, with wheezing  And mild leukocytosis.Marland Kitchen Possible early bronchitis/lower respiratory tract infection.  - start Z-Pak, side effects discussed, Mucinex and saline nasal spray. Stop Mucinex  if causing increased wheeze.  - albuterol prescription refilled. RTC precautions if persistent or increased use   Shakiness - Plan: POCT CBC, History of seizure  - denies true seizure activity recently, suspect shakiness is due to current illness. Advised increased fluids, rest. Did advise to call his neurologist to determine follow-up plan, as  It appears he is post follow-up with them last year for possible 24-hour EEG. RTC/ER precautions   Meds ordered this encounter  Medications  . albuterol (PROVENTIL HFA;VENTOLIN HFA) 108 (90 BASE) MCG/ACT inhaler    Sig: Inhale 1-2 puffs into the lungs every 4 (four) hours as needed for wheezing or shortness of breath.    Dispense:  1 Inhaler    Refill:  0  . azithromycin (ZITHROMAX) 250 MG tablet    Sig: Take 2 pills by mouth on day 1, then 1 pill by mouth per day on days 2 through 5.    Dispense:  6 tablet    Refill:  0   Patient Instructions  You may have a viral upper respiratory infection,  Or early bronchitis that has triggered some of the wheezing with your previous asthma. You can use the albuterol inhaler up to every 4-6 hours as needed. If you do have to use this medication more than 2-3 times per day or persistently uses medicine more than 3 days in row, we may need to use a different medication. Saline nasal spray atleast 4 times per day, over the counter mucinex or mucinex DM is ok for cough as long as it does not increase wheezing.    your infection fighting cells were borderline elevated, so we can start azithromycin to  cover for possible bacterial cause of your symptoms, including an early pneumonia as this can sometimes start this way. If you are worsening, return in the next 48-72 hours for repeat exam. Sooner or to emergency room if worse  Drink plenty of fluids, rest as needed. See information below on upper respiratory infection and cough. Return to the clinic or go to the nearest emergency room if any of your symptoms worsen or new symptoms occur.  Your shakiness may be due to your current infection, but if any seizure activity, need to proceed to the emergency room. I would also recommend you call your neurologist as it appears when seen last year they did want to follow back up with you and possible further testing. Call their office to determine if follow-up needed.  Upper Respiratory Infection, Adult An upper respiratory infection (URI) is also sometimes known as the common cold. The upper respiratory tract includes the nose, sinuses, throat, trachea, and bronchi. Bronchi are the airways leading to the lungs. Most people improve within 1 week, but symptoms can last up to 2 weeks. A residual cough may last even longer.  CAUSES Many different viruses can infect the tissues lining the upper respiratory tract. The tissues become irritated and inflamed and often become very moist. Mucus production is also common. A cold is contagious. You can easily spread the virus to others by oral contact. This includes kissing, sharing a glass, coughing, or sneezing. Touching your mouth or nose and then touching a surface, which is then touched by another person, can also spread the virus. SYMPTOMS  Symptoms typically develop 1 to 3 days after you come in contact with a cold virus. Symptoms vary from person to person. They may include:  Runny nose.  Sneezing.  Nasal congestion.  Sinus irritation.  Sore throat.  Loss  of voice (laryngitis).  Cough.  Fatigue.  Muscle aches.  Loss of  appetite.  Headache.  Low-grade fever. DIAGNOSIS  You might diagnose your own cold based on familiar symptoms, since most people get a cold 2 to 3 times a year. Your caregiver can confirm this based on your exam. Most importantly, your caregiver can check that your symptoms are not due to another disease such as strep throat, sinusitis, pneumonia, asthma, or epiglottitis. [name] tests, throat tests, and X-rays are not necessary to diagnose a common cold, but they may sometimes be helpful in excluding other more serious diseases. Your caregiver will decide if any further tests are required. RISKS AND COMPLICATIONS  You may be at risk for a more severe case of the common cold if you smoke cigarettes, have chronic heart disease (such as heart failure) or lung disease (such as asthma), or if you have a weakened immune system. The very young and very old are also at risk for more serious infections. Bacterial sinusitis, middle ear infections, and bacterial pneumonia can complicate the common cold. The common cold can worsen asthma and chronic obstructive pulmonary disease (COPD). Sometimes, these complications can require emergency medical care and may be life-threatening. PREVENTION  The best way to protect against getting a cold is to practice good hygiene. Avoid oral or hand contact with people with cold symptoms. Wash your hands often if contact occurs. There is no clear evidence that vitamin C, vitamin E, echinacea, or exercise reduces the chance of developing a cold. However, it is always recommended to get plenty of rest and practice good nutrition. TREATMENT  Treatment is directed at relieving symptoms. There is no cure. Antibiotics are not effective, because the infection is caused by a virus, not by bacteria. Treatment may include:  Increased fluid intake. Sports drinks offer valuable electrolytes, sugars, and fluids.  Breathing heated mist or steam (vaporizer or shower).  Eating chicken soup  or other clear broths, and maintaining good nutrition.  Getting plenty of rest.  Using gargles or lozenges for comfort.  Controlling fevers with ibuprofen or acetaminophen as directed by your caregiver.  Increasing usage of your inhaler if you have asthma. Zinc gel and zinc lozenges, taken in the first 24 hours of the common cold, can shorten the duration and lessen the severity of symptoms. Pain medicines may help with fever, muscle aches, and throat pain. A variety of non-prescription medicines are available to treat congestion and runny nose. Your caregiver can make recommendations and may suggest nasal or lung inhalers for other symptoms.  HOME CARE INSTRUCTIONS   Only take over-the-counter or prescription medicines for pain, discomfort, or fever as directed by your caregiver.  Use a warm mist humidifier or inhale steam from a shower to increase air moisture. This may keep secretions moist and make it easier to breathe.  Drink enough water and fluids to keep your urine clear or pale yellow.  Rest as needed.  Return to work when your temperature has returned to normal or as your caregiver advises. You may need to stay home longer to avoid infecting others. You can also use a face mask and careful hand washing to prevent spread of the virus. SEEK MEDICAL CARE IF:   After the first few days, you feel you are getting worse rather than better.  You need your caregiver's advice about medicines to control symptoms.  You develop chills, worsening shortness of breath, or brown or red sputum. These may be signs of pneumonia.  You  develop yellow or brown nasal discharge or pain in the face, especially when you bend forward. These may be signs of sinusitis.  You develop a fever, swollen neck glands, pain with swallowing, or white areas in the back of your throat. These may be signs of strep throat. SEEK IMMEDIATE MEDICAL CARE IF:   You have a fever.  You develop severe or persistent  headache, ear pain, sinus pain, or chest pain.  You develop wheezing, a prolonged cough, cough up [name], or have a change in your usual mucus (if you have chronic lung disease).  You develop sore muscles or a stiff neck. Document Released: 11/26/2000 Document Revised: 08/25/2011 Document Reviewed: 09/07/2013 [facility] Patient Information 2015 [place], [place]. This information is not intended to replace advice given to you by your health care provider. Make sure you discuss any questions you have with your health care provider.   Bronchospasm A bronchospasm is when the tubes that carry air in and out of your lungs (airways) spasm or tighten. During a bronchospasm it is hard to breathe. This is because the airways get smaller. A bronchospasm can be triggered by:  Allergies. These may be to animals, pollen, food, or mold.  Infection. This is a common cause of bronchospasm.  Exercise.  Irritants. These include pollution, cigarette smoke, strong odors, aerosol sprays, and paint fumes.  Weather changes.  Stress.  Being emotional. HOME CARE   Always have a plan for getting help. Know when to call your doctor and local emergency services (911 in the [place] Know where you can get emergency care.  Only take medicines as told by your doctor.  If you were prescribed an inhaler or nebulizer machine, ask your doctor how to use it correctly. Always use a spacer with your inhaler if you were given one.  Stay calm during an attack. Try to relax and breathe more slowly.  Control your home environment:  Change your heating and air conditioning filter at least once a month.  Limit your use of fireplaces and wood stoves.  Do not  smoke. Do not  allow smoking in your home.  Avoid perfumes and fragrances.  Get rid of pests (such as roaches and mice) and their droppings.  Throw away plants if you see mold on them.  Keep your house clean and dust free.  Replace carpet with wood, tile, or  vinyl flooring. Carpet can trap dander and dust.  Use allergy-proof pillows, mattress covers, and box spring covers.  Wash bed sheets and blankets every week in hot water. Dry them in a dryer.  Use blankets that are made of polyester or cotton.  Wash hands frequently. GET HELP IF:  You have muscle aches.  You have chest pain.  The thick spit you spit or cough up (sputum) changes from clear or white to yellow, green, gray, or bloody.  The thick spit you spit or cough up gets thicker.  There are problems that may be related to the medicine you are given such as:  A rash.  Itching.  Swelling.  Trouble breathing. GET HELP RIGHT AWAY IF:  You feel you cannot breathe or catch your breath.  You cannot stop coughing.  Your treatment is not helping you breathe better.  You have very bad chest pain. MAKE SURE YOU:   Understand these instructions.  Will watch your condition.  Will get help right away if you are not doing well or get worse. Document Released: 03/30/2009 Document Revised: 06/07/2013 Document Reviewed: 11/23/2012 [employer]  Patient Information 2015 [place]. This information is not intended to replace advice given to you by your health care provider. Make sure you discuss any questions you have with your health care provider.   Cough, Adult  A cough is a reflex that helps clear your throat and airways. It can help heal the body or may be a reaction to an irritated airway. A cough may only last 2 or 3 weeks (acute) or may last more than 8 weeks (chronic).  CAUSES Acute cough:  Viral or bacterial infections. Chronic cough:  Infections.  Allergies.  Asthma.  Post-nasal drip.  Smoking.  Heartburn or acid reflux.  Some medicines.  Chronic lung problems (COPD).  Cancer. SYMPTOMS   Cough.  Fever.  Chest pain.  Increased breathing rate.  High-pitched whistling sound when breathing (wheezing).  Colored mucus that you cough up  (sputum). TREATMENT   A bacterial cough may be treated with antibiotic medicine.  A viral cough must run its course and will not respond to antibiotics.  Your caregiver may recommend other treatments if you have a chronic cough. HOME CARE INSTRUCTIONS   Only take over-the-counter or prescription medicines for pain, discomfort, or fever as directed by your caregiver. Use cough suppressants only as directed by your caregiver.  Use a cold steam vaporizer or humidifier in your bedroom or home to help loosen secretions.  Sleep in a semi-upright position if your cough is worse at night.  Rest as needed.  Stop smoking if you smoke. SEEK IMMEDIATE MEDICAL CARE IF:   You have pus in your sputum.  Your cough starts to worsen.  You cannot control your cough with suppressants and are losing sleep.  You begin coughing up [name].  You have difficulty breathing.  You develop pain which is getting worse or is uncontrolled with medicine.  You have a fever. MAKE SURE YOU:   Understand these instructions.  Will watch your condition.  Will get help right away if you are not doing well or get worse. Document Released: 11/29/2010 Document Revised: 08/25/2011 Document Reviewed: 11/29/2010 [facility] Patient Information 2015 [place], [place]. This information is not intended to replace advice given to you by your health care provider. Make sure you discuss any questions you have with your health care provider.        By signing my name below, I, [name], attest that this documentation has been prepared under the direction and in the presence of Frederick Ray, MD. Electronically Signed: Moises [name], [place]. 03/11/2015 , 12:48 PM .   I personally performed the services described in this documentation, which was scribed in my presence. The recorded information has been reviewed and considered, and addended by me as needed.

## 2015-09-20 ENCOUNTER — Emergency Department (HOSPITAL_COMMUNITY): Payer: BLUE CROSS/BLUE SHIELD

## 2015-09-20 ENCOUNTER — Encounter (HOSPITAL_COMMUNITY): Payer: Self-pay | Admitting: Emergency Medicine

## 2015-09-20 ENCOUNTER — Emergency Department (HOSPITAL_COMMUNITY)
Admission: EM | Admit: 2015-09-20 | Discharge: 2015-09-20 | Disposition: A | Discharge status: Home / Self Care | Payer: BLUE CROSS/BLUE SHIELD | Attending: Emergency Medicine | Admitting: Emergency Medicine

## 2015-09-20 DIAGNOSIS — F419 Anxiety disorder, unspecified: Secondary | ICD-10-CM | POA: Insufficient documentation

## 2015-09-20 DIAGNOSIS — F17210 Nicotine dependence, cigarettes, uncomplicated: Secondary | ICD-10-CM | POA: Insufficient documentation

## 2015-09-20 DIAGNOSIS — Z79899 Other long term (current) drug therapy: Secondary | ICD-10-CM | POA: Insufficient documentation

## 2015-09-20 DIAGNOSIS — Z8739 Personal history of other diseases of the musculoskeletal system and connective tissue: Secondary | ICD-10-CM | POA: Insufficient documentation

## 2015-09-20 DIAGNOSIS — K219 Gastro-esophageal reflux disease without esophagitis: Secondary | ICD-10-CM | POA: Insufficient documentation

## 2015-09-20 DIAGNOSIS — R079 Chest pain, unspecified: Secondary | ICD-10-CM | POA: Insufficient documentation

## 2015-09-20 DIAGNOSIS — Z87442 Personal history of urinary calculi: Secondary | ICD-10-CM | POA: Insufficient documentation

## 2015-09-20 DIAGNOSIS — J45901 Unspecified asthma with (acute) exacerbation: Secondary | ICD-10-CM | POA: Insufficient documentation

## 2015-09-20 LAB — I-STAT TROPONIN, ED
TROPONIN I, POC: 0 ng/mL (ref 0.00–0.08)
Troponin i, poc: 0 ng/mL (ref 0.00–0.08)

## 2015-09-20 LAB — BASIC METABOLIC PANEL
Anion gap: 9 (ref 5–15)
BUN: 16 mg/dL (ref 6–20)
CHLORIDE: 107 mmol/L (ref 101–111)
CO2: 26 mmol/L (ref 22–32)
Calcium: 9.1 mg/dL (ref 8.9–10.3)
Creatinine, Ser: 1.17 mg/dL (ref 0.61–1.24)
GFR calc non Af Amer: >60 mL/min (ref >60)
Glucose, Bld: 87 mg/dL (ref 65–99)
Potassium: 4.1 mmol/L (ref 3.5–5.1)
SODIUM: 142 mmol/L (ref 135–145)

## 2015-09-20 LAB — CBC
HEMATOCRIT: 46.7 % (ref 39.0–52.0)
HEMOGLOBIN: 15.6 g/dL (ref 13.0–17.0)
MCH: 30.3 pg (ref 26.0–34.0)
MCHC: 33.4 g/dL (ref 30.0–36.0)
MCV: 90.7 fL (ref 78.0–100.0)
Platelets: 263 10*3/uL (ref 150–400)
RBC: 5.15 MIL/uL (ref 4.22–5.81)
RDW: 13.5 % (ref 11.5–15.5)
WBC: 9.9 10*3/uL (ref 4.0–10.5)

## 2015-09-20 MED ORDER — IPRATROPIUM-ALBUTEROL 0.5-2.5 (3) MG/3ML IN SOLN
3.0000 mL | Freq: Once | RESPIRATORY_TRACT | Status: AC
  Administered 2015-09-20: 3 mL via RESPIRATORY_TRACT
  Filled 2015-09-20: qty 3

## 2015-09-20 MED ORDER — ASPIRIN 81 MG PO CHEW
324.0000 mg | CHEWABLE_TABLET | Freq: Once | ORAL | Status: AC
  Administered 2015-09-20: 324 mg via ORAL
  Filled 2015-09-20: qty 4

## 2015-09-20 NOTE — Discharge Instructions (Signed)
Nonspecific Chest Pain Follow up with your primary care provider. You may need a stress test. Return for chest pain, shortness of breath, sweating, lightheadedness, dizziness, syncope.  It is often hard to find the cause of chest pain. There is always a chance that your pain could be related to something serious, such as a heart attack or a blood clot in your lungs. Chest pain can also be caused by conditions that are not life-threatening. If you have chest pain, it is very important to follow up with your doctor.  HOME CARE  If you were prescribed an antibiotic medicine, finish it all even if you start to feel better.  Avoid any activities that cause chest pain.  Do not use any tobacco products, including cigarettes, chewing tobacco, or electronic cigarettes. If you need help quitting, ask your doctor.  Do not drink alcohol.  Take medicines only as told by your doctor.  Keep all follow-up visits as told by your doctor. This is important. This includes any further testing if your chest pain does not go away.  Your doctor may tell you to keep your head raised (elevated) while you sleep.  Make lifestyle changes as told by your doctor. These may include:  Getting regular exercise. Ask your doctor to suggest some activities that are safe for you.  Eating a heart-healthy diet. Your doctor or a diet specialist (dietitian) can help you to learn healthy eating options.  Maintaining a healthy weight.  Managing diabetes, if necessary.  Reducing stress. GET HELP IF:  Your chest pain does not go away, even after treatment.  You have a rash with blisters on your chest.  You have a fever. GET HELP RIGHT AWAY IF:  Your chest pain is worse.  You have an increasing cough, or you cough up blood.  You have severe belly (abdominal) pain.  You feel extremely weak.  You pass out (faint).  You have chills.  You have sudden, unexplained chest discomfort.  You have sudden, unexplained  discomfort in your arms, back, neck, or jaw.  You have shortness of breath at any time.  You suddenly start to sweat, or your skin gets clammy.  You feel nauseous.  You vomit.  You suddenly feel light-headed or dizzy.  Your heart begins to beat quickly, or it feels like it is skipping beats. These symptoms may be an emergency. Do not wait to see if the symptoms will go away. Get medical help right away. Call your local emergency services (911 in the [place] Do not drive yourself to the hospital.   This information is not intended to replace advice given to you by your health care provider. Make sure you discuss any questions you have with your health care provider.   Document Released: 11/19/2007 Document Revised: 06/23/2014 Document Reviewed: 01/06/2014 Elsevier Interactive Patient Education [employer].

## 2015-09-20 NOTE — ED Notes (Signed)
Pt transported to xray 

## 2015-09-20 NOTE — ED Notes (Signed)
Pt reports left sided CP starting this morning radiating to left jaw and left arm. Pt alert x4. NAD at this time.

## 2015-09-20 NOTE — ED Notes (Signed)
Pt returned to [facility] from x-ray.

## 2015-09-20 NOTE — ED Provider Notes (Signed)
CSN: [id number]     Arrival date & time 09/20/15  1020 History   First MD Initiated Contact with Patient 09/20/15 1110     Chief Complaint  Patient presents with  . Chest Pain   (Consider location/radiation/quality/duration/timing/severity/associated sxs/prior Treatment) Patient is a 40 y.o. male presenting with chest pain. The history is provided by the patient. No language interpreter was used.  Chest Pain Associated symptoms: shortness of breath   Associated symptoms: no fever and no palpitations     [name] is a 40 y.o male with a hsitory of palpitations (from weight loss medication), asthma, kidney stones, seizures presents for shortness of breath that began around 6 AM. He took 2 puffs of his rescue inhaler which did not help. He went to work and had gradual onset left-sided chest pain that began this morning and radiating to his left jaw and arm. He describes it as someone squeezing his heart. If her chest pain. States his grandfather died of heart failure. His dad also had some heart issues which she is unaware of. He smokes half a pack a day but sometimes more depending on how the day goes at work. He states he is under a lot of stress and has anxiety at times due to his job requirements. No history of MI, stress test or echo.  Past Medical History  Diagnosis Date  . Asthma   . Kidney stones, mixed calcium oxalate   . GERD (gastroesophageal reflux disease)   . Bulging lumbar disc   . Anxiety   . Seizures ([place])   . Syncope and collapse   . Headache(784.0)   . Allergy    Past Surgical History  Procedure Laterality Date  . Cystoscopy w/ ureteral stent placement    . Extracorporeal shock wave lithotripsy    . Hernia repair    . Cervical disc arthroplasty    . Tonsillectomy    . Appendectomy    . Cholecystectomy     Family History  Problem Relation Age of Onset  . Hyperlipidemia Mother    Social History  Substance Use Topics  . Smoking status: Current Every Day Smoker  -- 0.50 packs/day    Types: Cigarettes  . Smokeless tobacco: None  . Alcohol Use: No     Comment: social    Review of Systems  Constitutional: Negative for fever.  Respiratory: Positive for shortness of breath.   Cardiovascular: Positive for chest pain. Negative for palpitations and leg swelling.  All other systems reviewed and are negative.     Allergies  Codeine and Hydrocodone  Home Medications   Prior to Admission medications   Medication Sig Start Date End Date Taking? Authorizing Provider  albuterol (PROVENTIL HFA;VENTOLIN HFA) 108 (90 BASE) MCG/ACT inhaler Inhale 1-2 puffs into the lungs every 4 (four) hours as needed for wheezing or shortness of breath. 03/11/15  Yes Wendie Agreste, MD  ALPRAZolam Duanne Moron) 1 MG tablet TAKE 1 TABLET BY MOUTH TWICE DAILY AS NEEDED FOR ANXIETY 11/15/14  Yes Gay Filler Copland, MD  ibuprofen (ADVIL,MOTRIN) 200 MG tablet Take 800 mg by mouth every 6 (six) hours as needed for mild pain.   Yes Historical Provider, MD  Oxycodone HCl 10 MG TABS Take 10 mg by mouth 3 (three) times daily.   Yes Historical Provider, MD  pantoprazole (PROTONIX) 40 MG tablet Take 1 tablet (40 mg total) by mouth daily. 11/10/14  Yes Amy S Esterwood, PA-C  pantoprazole (PROTONIX) 40 MG tablet Take 1 tablet (40 mg  total) by mouth 2 (two) times daily. Take 1 pill 20-30 minutes prior to breakfast and bedtime. 12/20/14  Yes Milus Banister, MD  azithromycin (ZITHROMAX) 250 MG tablet Take 2 pills by mouth on day 1, then 1 pill by mouth per day on days 2 through 5. 03/11/15   Wendie Agreste, MD   BP 123/88 mmHg  Pulse 69  Temp(Src) 98.1 F (36.7 C) (Oral)  Resp 18  Ht 6\' 1"  (1.854 m)  Wt 97.523 kg  BMI 28.37 kg/m2  SpO2 95% Physical Exam  Constitutional: He is oriented to person, place, and time. He appears well-developed and well-nourished.  HENT:  Head: Normocephalic and atraumatic.  Eyes: Conjunctivae are normal.  Neck: Normal range of motion. Neck supple.   Cardiovascular: Normal rate, regular rhythm and normal heart sounds.   No murmur heard. Regular rate and rhythm. No murmur. Lungs clear to auscultation bilaterally. No decreased breath sounds or wheezing.  Pulmonary/Chest: Effort normal and breath sounds normal. No respiratory distress. He has no wheezes. He has no rales.  Abdominal: Soft. There is no tenderness.  Musculoskeletal: Normal range of motion. He exhibits no edema or tenderness.  No lower extremity edema.   Neurological: He is alert and oriented to person, place, and time.  Skin: Skin is warm and dry.  Nursing note and vitals reviewed.   ED Course  Procedures (including critical care time) Labs Review Labs Reviewed  BASIC METABOLIC PANEL  CBC  I-STAT [place], ED  Randolm Idol, ED    Imaging Review Dg Chest 2 View  09/20/2015  CLINICAL DATA:  Shortness of breath, chest and LEFT arm pain since this morning, history asthma, smoker EXAM: CHEST  2 VIEW COMPARISON:  06/27/2013 FINDINGS: Enlargement of cardiac silhouette. Mediastinal contours and pulmonary vascularity normal. Decreased lung volumes with minimal subsegmental atelectasis RIGHT middle lobe. Lungs otherwise clear. No pleural effusion or pneumothorax. Bones unremarkable. IMPRESSION: Enlargement of cardiac silhouette. Low lung volumes with linear subsegmental atelectasis RIGHT middle lobe. Electronically Signed   By: Lavonia Dana M.D.   On: 09/20/2015 11:04   I have personally reviewed and evaluated these images and lab results as part of my medical decision-making.   EKG Interpretation   Date/Time:  Thursday September 20 2015 10:23:43 EDT Ventricular Rate:  76 PR Interval:  182 QRS Duration: 104 QT Interval:  378 QTC Calculation: 425 R Axis:   90 Text Interpretation:  Normal sinus rhythm Rightward axis Borderline ECG  Confirmed by ZACKOWSKI  MD, SCOTT ([id number]) on 09/20/2015 3:24:54 PM      MDM   Final diagnoses:  Chest pain, unspecified chest pain type    Patient presents for chest pain. He is well-appearing and in no acute distress. His vital signs were stable and he is afebrile. His EKG was not concerning. Labs were unremarkable. A second troponin was obtained and was also negative. Chest x-ray is negative for pneumothorax, infiltrate, or edema. He does have right middle lobe atelectasis. This is most likely caused by asthma. I discussed return precautions with the patient as well as follow-up with his primary care physician within 48 hours. He may need a stress test which I recommended to him since he stated he had episodes like this in the past but never followed up. They state that they have an established primary care physician who recommended that they come to the ED first when they called to tell them he was having chest pain.  I reviewed the heart score and patient is  at low risk for ACS or major cardiac event.  Medications  aspirin chewable tablet 324 mg (324 mg Oral Given 09/20/15 1140)  ipratropium-albuterol (DUONEB) 0.5-2.5 (3) MG/3ML nebulizer solution 3 mL (3 mLs Nebulization Given 09/20/15 1140)       Ottie Glazier, PA-C 09/20/15 1545  Fredia Sorrow, MD 09/20/15 1724

## 2015-09-27 NOTE — Progress Notes (Signed)
Cardiology Office Note   Date:  09/28/2015   ID:  [name], DOB 10/04/1975, MRN [id number]  PCP:  Donnie Coffin, MD  Cardiologist:  Odaly Peri Meredith Leeds, MD    Chief Complaint  Patient presents with  . Chest Pain     History of Present Illness: [name] is a 40 y.o. male who presents today for cardiology evaluation.    He presented  To the emergency room on 4/6 with shortness of breath and chest pain.  The pain was left sided radiating to the shoulder and jaw.  He took 2 puffs of his rescue inhaler which did not help.  He does smoke half pack a day but sometimes more depending on how the day goes at work. He does have a lot of stress and anxiety work. He had an EKG in the emergency room which showed no signs of ischemia. 2 troponins were obtained and were both negative. Chest x-ray was negative. He has had episodes like this in the past but has never followed up about them.Since that time, he has continued to have chest pain. He has not been working. He says that he has been not doing much over the last few weeks. He does say that when he does ambulate he gets the same chest pain in the center of his chest that sometimes radiates to his left arm.  Today, he denies symptoms of palpitations, chest pain, shortness of breath, orthopnea, PND, lower extremity edema, claudication, dizziness, presyncope, syncope, bleeding, or neurologic sequela. The patient is tolerating medications without difficulties and is otherwise without complaint today.    Past Medical History  Diagnosis Date  . Asthma   . Kidney stones, mixed calcium oxalate   . GERD (gastroesophageal reflux disease)   . Bulging lumbar disc   . Anxiety   . Seizures ([place])   . Syncope and collapse   . Headache(784.0)   . Allergy    Past Surgical History  Procedure Laterality Date  . Cystoscopy w/ ureteral stent placement    . Extracorporeal shock wave lithotripsy    . Hernia repair    . Cervical disc arthroplasty    .  Tonsillectomy    . Appendectomy    . Cholecystectomy       Current Outpatient Prescriptions  Medication Sig Dispense Refill  . albuterol (PROVENTIL HFA;VENTOLIN HFA) 108 (90 BASE) MCG/ACT inhaler Inhale 1-2 puffs into the lungs every 4 (four) hours as needed for wheezing or shortness of breath. 1 Inhaler 0  . ALPRAZolam (XANAX) 1 MG tablet TAKE 1 TABLET BY MOUTH TWICE DAILY AS NEEDED FOR ANXIETY 60 tablet 0  . ibuprofen (ADVIL,MOTRIN) 200 MG tablet Take 800 mg by mouth every 6 (six) hours as needed for mild pain.    . Oxycodone HCl 10 MG TABS Take 10 mg by mouth 3 (three) times daily.    . pantoprazole (PROTONIX) 40 MG tablet Take 1 tablet (40 mg total) by mouth 2 (two) times daily. Take 1 pill 20-30 minutes prior to breakfast and bedtime. 60 tablet 5   No current facility-administered medications for this visit.    Allergies:   Codeine and Hydrocodone   Social History:  The patient  reports that he has been smoking Cigarettes.  He has been smoking about 0.50 packs per day. He does not have any smokeless tobacco history on file. He reports that he does not drink alcohol or use illicit drugs.   Family History:  The patient's family history  includes Bladder Cancer in his maternal grandfather; Breast cancer in his maternal grandmother; COPD in his maternal grandfather; Cancer in his maternal uncle; Heart disease in his maternal grandfather; Hyperlipidemia in his mother; Lung cancer in his maternal grandmother.    ROS:  Please see the history of present illness.   Otherwise, review of systems is positive for Chest pain, shortness of breath, fatigue, cough, dyspnea on exertion, snoring, wheezing, depression, anxiety, back and muscle pain, balance problems, dizziness, headaches.   All other systems are reviewed and negative.    PHYSICAL EXAM: VS:  BP 132/86 mmHg  Pulse 66  Ht 6\' 1"  (1.854 m)  Wt 238 lb 6.4 oz (108.138 kg)  BMI 31.46 kg/m2 , BMI Body mass index is 31.46 kg/(m^2). GEN: Well  nourished, well developed, in no acute distress HEENT: normal Neck: no JVD, carotid bruits, or masses Cardiac: RRR; no murmurs, rubs, or gallops,no edema  Respiratory:  clear to auscultation bilaterally, normal work of breathing GI: soft, nontender, nondistended, + BS MS: no deformity or atrophy Skin: warm and dry Neuro:  Strength and sensation are intact Psych: euthymic mood, full affect  EKG:  EKG is ordered today. The ekg ordered today shows sinus rhythm, rate 66, no ST/T wave changes  Recent Labs: 10/23/2014: ALT 26 09/20/2015: BUN 16; Creatinine, Ser 1.17; Hemoglobin 15.6; Platelets 263; Potassium 4.1; Sodium 142    Lipid Panel  No results found for: CHOL, TRIG, HDL, CHOLHDL, VLDL, LDLCALC, LDLDIRECT   Wt Readings from Last 3 Encounters:  09/28/15 238 lb 6.4 oz (108.138 kg)  09/20/15 215 lb (97.523 kg)  03/11/15 226 lb (102.513 kg)      Other studies Reviewed: Additional studies/ records that were reviewed today include: ER notes   ASSESSMENT AND PLAN:  1.  Chest pain: At this time, he does have symptoms of typical angina with worsening on exertion and improving with rest and substernal pain. This is, located by the fact that he is a continuing smoker, and has borderline hypertension that he says has been treated in the past. He is currently not on any medications for hypertension, and his blood pressure is normal today. Due to the fact that he does have typical sounding chest pain, we'll order a stress Myoview to determine if he has coronary disease. I have advised him to stop smoking as well and he is agreeable to that.   Current medicines are reviewed at length with the patient today.   The patient does not have concerns regarding his medicines.  The following changes were made today:  none  Labs/ tests ordered today include:  Orders Placed This Encounter  Procedures  . Myocardial Perfusion Imaging  . EKG 12-Lead     Disposition:   FU with [facility] Colden post  imaging  Signed, Copeland Neisen Meredith Leeds, MD  09/28/2015 12:39 PM     [facility] [address] [place] [address] [phone] (office) [phone] (fax)

## 2015-09-28 ENCOUNTER — Encounter: Payer: Self-pay | Admitting: *Deleted

## 2015-09-28 ENCOUNTER — Ambulatory Visit (INDEPENDENT_AMBULATORY_CARE_PROVIDER_SITE_OTHER): Payer: Self-pay | Admitting: Cardiology

## 2015-09-28 ENCOUNTER — Encounter: Payer: Self-pay | Admitting: Cardiology

## 2015-09-28 VITALS — BP 132/86 | HR 66 | Ht 73.0 in | Wt 238.4 lb

## 2015-09-28 DIAGNOSIS — R079 Chest pain, unspecified: Secondary | ICD-10-CM

## 2015-09-28 NOTE — Patient Instructions (Signed)
Medication Instructions:  Your physician recommends that you continue on your current medications as directed. Please refer to the Current Medication list given to you today.   Labwork: None ordered   Testing/Procedures: Your physician has requested that you have en exercise stress myoview. For further information please visit [email or web address]. Please follow instruction sheet, as given.    Follow-Up: Your physician recommends that you schedule a follow-up appointment ---will depend on results of test.  We will call you with results of stress test   Any Other Special Instructions Will Be Listed Below (If Applicable).     If you need a refill on your cardiac medications before your next appointment, please call your pharmacy.

## 2015-10-01 ENCOUNTER — Telehealth (HOSPITAL_COMMUNITY): Payer: Self-pay | Admitting: *Deleted

## 2015-10-01 NOTE — Telephone Encounter (Signed)
Patient given detailed instructions per Myocardial Perfusion Study Information Sheet for the test on 10/02/15 at 0730. Patient notified to arrive 15 minutes early and that it is imperative to arrive on time for appointment to keep from having the test rescheduled.  If you need to cancel or reschedule your appointment, please call the office within 24 hours of your appointment. Failure to do so may result in a cancellation of your appointment, and a $50 no show fee. Patient verbalized understanding.Kenney Houseman Yount,CNMT

## 2015-10-02 ENCOUNTER — Ambulatory Visit (HOSPITAL_COMMUNITY): Payer: Self-pay | Attending: Cardiology

## 2015-10-02 ENCOUNTER — Telehealth: Payer: Self-pay | Admitting: Cardiology

## 2015-10-02 DIAGNOSIS — Z801 Family history of malignant neoplasm of trachea, bronchus and lung: Secondary | ICD-10-CM

## 2015-10-02 DIAGNOSIS — R0602 Shortness of breath: Secondary | ICD-10-CM

## 2015-10-02 DIAGNOSIS — R079 Chest pain, unspecified: Secondary | ICD-10-CM | POA: Insufficient documentation

## 2015-10-02 DIAGNOSIS — R0609 Other forms of dyspnea: Secondary | ICD-10-CM | POA: Insufficient documentation

## 2015-10-02 DIAGNOSIS — Z720 Tobacco use: Secondary | ICD-10-CM | POA: Insufficient documentation

## 2015-10-02 LAB — MYOCARDIAL PERFUSION IMAGING
CHL RATE OF PERCEIVED EXERTION: 19
CSEPEW: 8.1 METS
CSEPHR: 75 %
CSEPPHR: 137 {beats}/min
Exercise duration (min): 6 min
Exercise duration (sec): 0 s
LHR: 0.4
LVDIAVOL: 129 mL (ref 62–150)
LVSYSVOL: 62 mL
MPHR: 181 {beats}/min
Rest HR: 65 {beats}/min
SDS: 1
SRS: 4
SSS: 5
TID: 0.91

## 2015-10-02 MED ORDER — TECHNETIUM TC 99M SESTAMIBI GENERIC - CARDIOLITE
30.9000 | Freq: Once | INTRAVENOUS | Status: AC | PRN
  Administered 2015-10-02: 30.9 via INTRAVENOUS

## 2015-10-02 MED ORDER — TECHNETIUM TC 99M SESTAMIBI GENERIC - CARDIOLITE
11.0000 | Freq: Once | INTRAVENOUS | Status: AC | PRN
  Administered 2015-10-02: 11 via INTRAVENOUS

## 2015-10-02 MED ORDER — REGADENOSON 0.4 MG/5ML IV SOLN
0.4000 mg | Freq: Once | INTRAVENOUS | Status: AC
  Administered 2015-10-02: 0.4 mg via INTRAVENOUS

## 2015-10-02 NOTE — Telephone Encounter (Signed)
Informed patient's wife that test had not been reviewed by Dr. Curt Bears yet, but she was given preliminary results. She understands I will have him look at result tomorrow and I will call her with final findings.  She is very concerned with patient's SOB and what this may be. Patient has hx of asthma. Strong hx of lung CA in family.   She understands we will consider pulmonology consult for further evaluation of SOB and get recommendations from Dr. Curt Bears.

## 2015-10-02 NOTE — Telephone Encounter (Signed)
New message ° ° ° ° ° °Calling to get stress test results °

## 2015-10-03 ENCOUNTER — Encounter: Payer: Self-pay | Admitting: Cardiology

## 2015-10-03 NOTE — Telephone Encounter (Signed)
RE: Pulmonology referral  Received: Today    Sonya T Pegram  Margorie Renner Laneta Simmers, RN           Pulmonary has attempted to reach pt. Left message for them to call for appt.  Sonya       Previous Messages     ----- Message -----   From: Stanton Kidney, RN   Sent: 10/03/2015  1:20 PM    To: Stanton Kidney, RN, [facility]  Subject: Pulmonology referral               Please place referral to pulmonology for SOB and family hx of lung CA.

## 2015-10-03 NOTE — Telephone Encounter (Signed)
Wife returns my call.  Informed her that reviewed with Dr. Curt Bears who does not think his SOB is heart related. Referral to pulmonology placed in EPIC. Wife is aware office will contact her to arrange consult visit. She thanks me for helping and calling her.

## 2015-10-04 NOTE — Telephone Encounter (Signed)
Delma Post    SentMeredeth Ide October 04, 2015 4:41 PM    To: Stanton Kidney, RN        Message     10-23-15 @ 3:15 With Dr. Ashok Cordia - lvm to call to gave him the appointment

## 2015-10-23 ENCOUNTER — Institutional Professional Consult (permissible substitution): Payer: Self-pay | Admitting: Pulmonary Disease

## 2016-01-07 ENCOUNTER — Other Ambulatory Visit: Payer: Self-pay | Admitting: Gastroenterology

## 2016-01-07 DIAGNOSIS — R1013 Epigastric pain: Secondary | ICD-10-CM

## 2016-01-29 ENCOUNTER — Emergency Department (HOSPITAL_COMMUNITY): Payer: Self-pay

## 2016-01-29 ENCOUNTER — Emergency Department (HOSPITAL_COMMUNITY)
Admission: EM | Admit: 2016-01-29 | Discharge: 2016-01-29 | Disposition: A | Discharge status: Home / Self Care | Payer: Self-pay | Attending: Physician Assistant | Admitting: Physician Assistant

## 2016-01-29 ENCOUNTER — Encounter (HOSPITAL_COMMUNITY): Payer: Self-pay | Admitting: Emergency Medicine

## 2016-01-29 DIAGNOSIS — R1084 Generalized abdominal pain: Secondary | ICD-10-CM | POA: Insufficient documentation

## 2016-01-29 DIAGNOSIS — J45909 Unspecified asthma, uncomplicated: Secondary | ICD-10-CM | POA: Insufficient documentation

## 2016-01-29 DIAGNOSIS — F17210 Nicotine dependence, cigarettes, uncomplicated: Secondary | ICD-10-CM | POA: Insufficient documentation

## 2016-01-29 LAB — COMPREHENSIVE METABOLIC PANEL
ALK PHOS: 86 U/L (ref 38–126)
ALT: 23 U/L (ref 17–63)
ANION GAP: 10 (ref 5–15)
AST: 16 U/L (ref 15–41)
Albumin: 4.2 g/dL (ref 3.5–5.0)
BUN: 14 mg/dL (ref 6–20)
CHLORIDE: 108 mmol/L (ref 101–111)
CO2: 20 mmol/L — AB (ref 22–32)
Calcium: 9.3 mg/dL (ref 8.9–10.3)
Creatinine, Ser: 1.18 mg/dL (ref 0.61–1.24)
GFR calc non Af Amer: >60 mL/min (ref >60)
Glucose, Bld: 93 mg/dL (ref 65–99)
Potassium: 4.1 mmol/L (ref 3.5–5.1)
SODIUM: 138 mmol/L (ref 135–145)
Total Bilirubin: 0.5 mg/dL (ref 0.3–1.2)
Total Protein: 7.2 g/dL (ref 6.5–8.1)

## 2016-01-29 LAB — LIPASE, BLOOD: LIPASE: 24 U/L (ref 11–51)

## 2016-01-29 LAB — I-STAT TROPONIN, ED: TROPONIN I, POC: 0 ng/mL (ref 0.00–0.08)

## 2016-01-29 LAB — CBC
HCT: 51.2 % (ref 39.0–52.0)
Hemoglobin: 17.4 g/dL — ABNORMAL HIGH (ref 13.0–17.0)
MCH: 30.9 pg (ref 26.0–34.0)
MCHC: 34 g/dL (ref 30.0–36.0)
MCV: 90.8 fL (ref 78.0–100.0)
PLATELETS: 273 10*3/uL (ref 150–400)
RBC: 5.64 MIL/uL (ref 4.22–5.81)
RDW: 13.3 % (ref 11.5–15.5)
WBC: 12.6 10*3/uL — AB (ref 4.0–10.5)

## 2016-01-29 MED ORDER — DICYCLOMINE HCL 20 MG PO TABS: 10.0000 mg | ORAL_TABLET | Freq: Three times a day (TID) | ORAL | 0 refills | Status: DC | PRN

## 2016-01-29 MED ORDER — ONDANSETRON HCL 4 MG PO TABS
4.0000 mg | ORAL_TABLET | Freq: Once | ORAL | Status: AC
  Administered 2016-01-29: 4 mg via ORAL
  Filled 2016-01-29: qty 1

## 2016-01-29 MED ORDER — DICYCLOMINE HCL 10 MG PO CAPS
10.0000 mg | ORAL_CAPSULE | Freq: Once | ORAL | Status: AC
  Administered 2016-01-29: 10 mg via ORAL
  Filled 2016-01-29: qty 1

## 2016-01-29 NOTE — Discharge Instructions (Signed)
Please follow-up with your GI physician. If you continue to have diarrhea please bring it to your primary care physician for testing. Please return if you have any abdominal pain, vomiting or fever.

## 2016-01-29 NOTE — ED Provider Notes (Signed)
[facility] DEPT Provider Note   CSN: [id number] Arrival date & time: 01/29/16  1341     History   Chief Complaint Chief Complaint  Patient presents with  . Abdominal Pain  . Chest Pain    HPI [name] is a 40 y.o. male.  The history is provided by the patient.  Abdominal Pain   This is a recurrent problem. The current episode started 3 to 5 hours ago. The problem occurs constantly. The problem has been gradually improving. The pain is associated with diet changes. The pain is located in the generalized abdominal region. The quality of the pain is cramping. The pain is at a severity of 2/10. The pain is mild. Associated symptoms include belching, diarrhea, flatus and nausea. Pertinent negatives include anorexia, vomiting and constipation. The symptoms are aggravated by eating and drinking alcohol. Nothing relieves the symptoms. Past workup includes GI consult, CT scan and ultrasound.  Chest Pain   Associated symptoms include abdominal pain and nausea. Pertinent negatives include no shortness of breath and no vomiting.    Past Medical History:  Diagnosis Date  . Allergy   . Anxiety   . Asthma   . Bulging lumbar disc   . GERD (gastroesophageal reflux disease)   . Headache(784.0)   . Kidney stones, mixed calcium oxalate   . Seizures ([place])   . Syncope and collapse     Patient Active Problem List   Diagnosis Date Noted  . Chronic back pain 11/10/2014  . Seizures ([place]) 10/12/2013  . Anxiety state, unspecified 10/12/2013    Past Surgical History:  Procedure Laterality Date  . APPENDECTOMY    . CERVICAL DISC ARTHROPLASTY    . CHOLECYSTECTOMY    . CYSTOSCOPY W/ URETERAL STENT PLACEMENT    . EXTRACORPOREAL SHOCK WAVE LITHOTRIPSY    . HERNIA REPAIR    . TONSILLECTOMY         Home Medications    Prior to Admission medications   Medication Sig Start Date End Date Taking? Authorizing Provider  albuterol (PROVENTIL HFA;VENTOLIN HFA) 108 (90 BASE) MCG/ACT  inhaler Inhale 1-2 puffs into the lungs every 4 (four) hours as needed for wheezing or shortness of breath. 03/11/15   Wendie Agreste, MD  ALPRAZolam Duanne Moron) 1 MG tablet TAKE 1 TABLET BY MOUTH TWICE DAILY AS NEEDED FOR ANXIETY 11/15/14   Gay Filler Copland, MD  ibuprofen (ADVIL,MOTRIN) 200 MG tablet Take 800 mg by mouth every 6 (six) hours as needed for mild pain.    Historical Provider, MD  Oxycodone HCl 10 MG TABS Take 10 mg by mouth 3 (three) times daily.    Historical Provider, MD  pantoprazole (PROTONIX) 40 MG tablet TAKE 1 TABLET BY MOUTH TWICE DAILY 20 TO 30 MINUTES PRIOR TO BREAKFAST AND AT BEDTIME 01/08/16   Milus Banister, MD    Family History Family History  Problem Relation Age of Onset  . Hyperlipidemia Mother   . Lung cancer Maternal Uncle     Died at 30  . Lung cancer Maternal Grandmother   . Breast cancer Maternal Grandmother   . Cancer - Other Maternal Grandmother     Lip cancer  . COPD Maternal Grandfather   . Bladder Cancer Maternal Grandfather   . Heart disease Maternal Grandfather   . Kidney disease Maternal Grandfather   . Congestive Heart Failure Maternal Grandfather   . Arrhythmia Maternal Grandfather     AFib  . Other Father     Bleeding ulcers  Social History Social History  Substance Use Topics  . Smoking status: Current Every Day Smoker    Packs/day: 0.50    Types: Cigarettes  . Smokeless tobacco: Not on file  . Alcohol use No     Comment: social     Allergies   Codeine and Hydrocodone   Review of Systems Review of Systems  Constitutional: Negative for activity change.  Respiratory: Negative for shortness of breath.   Cardiovascular: Positive for chest pain.  Gastrointestinal: Positive for abdominal distention, abdominal pain, diarrhea, flatus and nausea. Negative for anal bleeding, anorexia, blood in stool, constipation and vomiting.  All other systems reviewed and are negative.    Physical Exam Updated Vital Signs BP (!) 138/106  (BP Location: Left Arm)   Pulse 81   Temp 98.1 F (36.7 C) (Oral)   Resp 22   SpO2 97%   Physical Exam  Constitutional: He is oriented to person, place, and time. He appears well-nourished.  HENT:  Head: Normocephalic.  Mouth/Throat: Oropharynx is clear and moist.  Eyes: Conjunctivae are normal.  Neck: No tracheal deviation present.  Cardiovascular: Normal rate.   Pulmonary/Chest: Effort normal. No stridor. No respiratory distress.  Abdominal: Soft. There is no tenderness. There is no guarding.  Musculoskeletal: Normal range of motion. He exhibits no edema.  Neurological: He is oriented to person, place, and time. No cranial nerve deficit.  Skin: Skin is warm and dry. No rash noted. He is not diaphoretic.  Psychiatric: He has a normal mood and affect. His behavior is normal.  Nursing note and vitals reviewed.    ED Treatments / Results  Labs (all labs ordered are listed, but only abnormal results are displayed) Labs Reviewed  CBC - Abnormal; Notable for the following:       Result Value   WBC 12.6 (*)    Hemoglobin 17.4 (*)    All other components within normal limits  LIPASE, BLOOD  COMPREHENSIVE METABOLIC PANEL  I-STAT [place]OIN[place], ED    EKG  EKG Interpretation None       Radiology No results found.  Procedures Procedures (including critical care time)  Medications Ordered in ED Medications  dicyclomine (BENTYL) capsule 10 mg (not administered)  ondansetron (ZOFRAN) tablet 4 mg (not administered)     Initial Impression / Assessment and Plan / ED Course  I have reviewed the triage vital signs and the nursing notes.  Pertinent labs & imaging results that were available during my care of the patient were reviewed by me and considered in my medical decision making (see chart for details).  Clinical Course    Pt is a 40 year old male with acute on chronic abdominal pain. Has had extrensive work up prior by GI: colonoscopy, EGD etc. He reports he has "a  touch of Crohns" and has a follow up later this month. Here today with diarrhea X 3 hours and gassy cramping. No pain on abdominal exam. No fever, nv. Likely reaction to food. Do not suspect abdominal pathology requiring surgerical intervention.   Will help with symtpom relief, await labs, PO challenge and likely be able to discharge home. Normal physcial exam and vitals.   Final Clinical Impressions(s) / ED Diagnoses   Final diagnoses:  None    New Prescriptions New Prescriptions   No medications on file     Velina Drollinger Julio Alm, MD 01/29/16 1430

## 2016-01-29 NOTE — ED Triage Notes (Signed)
abd pain yesterday has had diarrhea , now bloated and having cp  And some sob on exertion

## 2016-02-05 ENCOUNTER — Other Ambulatory Visit: Payer: Self-pay | Admitting: Gastroenterology

## 2016-02-05 DIAGNOSIS — R1013 Epigastric pain: Secondary | ICD-10-CM

## 2016-02-14 ENCOUNTER — Telehealth: Payer: Self-pay | Admitting: *Deleted

## 2016-02-14 ENCOUNTER — Other Ambulatory Visit: Payer: Self-pay | Admitting: *Deleted

## 2016-02-14 DIAGNOSIS — R1013 Epigastric pain: Secondary | ICD-10-CM

## 2016-02-14 MED ORDER — PANTOPRAZOLE SODIUM 40 MG PO TBEC: 40.0000 mg | DELAYED_RELEASE_TABLET | Freq: Every day | ORAL | 1 refills | Status: AC

## 2016-02-14 NOTE — Telephone Encounter (Signed)
Sent # 30 with 1 refill per Nicoletta Ba PA.  No further refills  Until patient makes an appointment with either Dr. Ardis Hughs or Amy Esterwood PA-C.

## 2016-03-06 ENCOUNTER — Other Ambulatory Visit: Payer: Self-pay | Admitting: Gastroenterology

## 2016-03-06 DIAGNOSIS — R1013 Epigastric pain: Secondary | ICD-10-CM

## 2016-03-25 ENCOUNTER — Encounter (HOSPITAL_COMMUNITY): Payer: Self-pay | Admitting: Emergency Medicine

## 2016-03-25 ENCOUNTER — Emergency Department (HOSPITAL_COMMUNITY)
Admission: EM | Admit: 2016-03-25 | Discharge: 2016-03-25 | Disposition: A | Discharge status: Home / Self Care | Payer: Self-pay | Attending: Emergency Medicine | Admitting: Emergency Medicine

## 2016-03-25 ENCOUNTER — Emergency Department (HOSPITAL_COMMUNITY): Payer: Self-pay

## 2016-03-25 DIAGNOSIS — J45909 Unspecified asthma, uncomplicated: Secondary | ICD-10-CM | POA: Insufficient documentation

## 2016-03-25 DIAGNOSIS — R10A2 Flank pain, left side: Secondary | ICD-10-CM

## 2016-03-25 DIAGNOSIS — R109 Unspecified abdominal pain: Secondary | ICD-10-CM

## 2016-03-25 DIAGNOSIS — N200 Calculus of kidney: Secondary | ICD-10-CM

## 2016-03-25 DIAGNOSIS — Z79899 Other long term (current) drug therapy: Secondary | ICD-10-CM | POA: Insufficient documentation

## 2016-03-25 DIAGNOSIS — F17210 Nicotine dependence, cigarettes, uncomplicated: Secondary | ICD-10-CM | POA: Insufficient documentation

## 2016-03-25 LAB — COMPREHENSIVE METABOLIC PANEL WITH GFR
ALT: 17 U/L (ref 17–63)
AST: 15 U/L (ref 15–41)
Albumin: 3.6 g/dL (ref 3.5–5.0)
Alkaline Phosphatase: 70 U/L (ref 38–126)
Anion gap: 6 (ref 5–15)
BUN: 13 mg/dL (ref 6–20)
CO2: 26 mmol/L (ref 22–32)
Calcium: 8.6 mg/dL — ABNORMAL LOW (ref 8.9–10.3)
Chloride: 107 mmol/L (ref 101–111)
Creatinine, Ser: 1.11 mg/dL (ref 0.61–1.24)
GFR calc Af Amer: >60 mL/min
GFR calc non Af Amer: >60 mL/min
Glucose, Bld: 97 mg/dL (ref 65–99)
Potassium: 3.8 mmol/L (ref 3.5–5.1)
Sodium: 139 mmol/L (ref 135–145)
Total Bilirubin: 0.1 mg/dL — ABNORMAL LOW (ref 0.3–1.2)
Total Protein: 6.4 g/dL — ABNORMAL LOW (ref 6.5–8.1)

## 2016-03-25 LAB — CBC WITH DIFFERENTIAL/PLATELET
Basophils Absolute: 0 K/uL (ref 0.0–0.1)
Basophils Relative: 0 %
Eosinophils Absolute: 0.3 K/uL (ref 0.0–0.7)
Eosinophils Relative: 2 %
HCT: 45.9 % (ref 39.0–52.0)
Hemoglobin: 15.3 g/dL (ref 13.0–17.0)
Lymphocytes Relative: 37 %
Lymphs Abs: 4 K/uL (ref 0.7–4.0)
MCH: 30.4 pg (ref 26.0–34.0)
MCHC: 33.3 g/dL (ref 30.0–36.0)
MCV: 91.3 fL (ref 78.0–100.0)
Monocytes Absolute: 0.6 K/uL (ref 0.1–1.0)
Monocytes Relative: 6 %
Neutro Abs: 5.9 K/uL (ref 1.7–7.7)
Neutrophils Relative %: 55 %
Platelets: 244 K/uL (ref 150–400)
RBC: 5.03 MIL/uL (ref 4.22–5.81)
RDW: 13.7 % (ref 11.5–15.5)
WBC: 10.9 K/uL — ABNORMAL HIGH (ref 4.0–10.5)

## 2016-03-25 LAB — URINE MICROSCOPIC-ADD ON: SQUAMOUS EPITHELIAL / LPF: NONE SEEN

## 2016-03-25 LAB — URINALYSIS, ROUTINE W REFLEX MICROSCOPIC
BILIRUBIN URINE: NEGATIVE
Glucose, UA: NEGATIVE mg/dL
Ketones, ur: 15 mg/dL — AB
Leukocytes, UA: NEGATIVE
NITRITE: NEGATIVE
PROTEIN: NEGATIVE mg/dL
SPECIFIC GRAVITY, URINE: 1.027 (ref 1.005–1.030)
pH: 6 (ref 5.0–8.0)

## 2016-03-25 MED ORDER — OXYCODONE-ACETAMINOPHEN 5-325 MG PO TABS
1.0000 | ORAL_TABLET | Freq: Once | ORAL | Status: AC
  Administered 2016-03-25: 1 via ORAL
  Filled 2016-03-25: qty 1

## 2016-03-25 MED ORDER — OXYCODONE-ACETAMINOPHEN 5-325 MG PO TABS
ORAL_TABLET | ORAL | Status: AC
  Filled 2016-03-25: qty 1

## 2016-03-25 MED ORDER — TAMSULOSIN HCL 0.4 MG PO CAPS: 0.4000 mg | ORAL_CAPSULE | Freq: Every day | ORAL | 0 refills | Status: DC

## 2016-03-25 MED ORDER — HYDROMORPHONE HCL 1 MG/ML IJ SOLN
1.0000 mg | Freq: Once | INTRAMUSCULAR | Status: AC
  Administered 2016-03-25: 1 mg via INTRAVENOUS
  Filled 2016-03-25: qty 1

## 2016-03-25 MED ORDER — OXYCODONE-ACETAMINOPHEN 5-325 MG PO TABS: 2.0000 | ORAL_TABLET | ORAL | 0 refills | Status: DC | PRN

## 2016-03-25 MED ORDER — KETOROLAC TROMETHAMINE 30 MG/ML IJ SOLN
30.0000 mg | Freq: Once | INTRAMUSCULAR | Status: AC
  Administered 2016-03-25: 30 mg via INTRAVENOUS
  Filled 2016-03-25: qty 1

## 2016-03-25 MED ORDER — OXYCODONE-ACETAMINOPHEN 5-325 MG PO TABS
1.0000 | ORAL_TABLET | Freq: Once | ORAL | Status: AC
  Administered 2016-03-25: 1 via ORAL

## 2016-03-25 NOTE — ED Notes (Signed)
Pt asked for pain meds. Informed Tina - RN.

## 2016-03-25 NOTE — ED Notes (Signed)
Patient able to ambulate independently  

## 2016-03-25 NOTE — Discharge Instructions (Signed)
Follow up with urology. Take flomax and pain medication as needed. Drink plenty of water.

## 2016-03-25 NOTE — ED Provider Notes (Signed)
[facility] DEPT Provider Note   CSN: [id number] Arrival date & time: 03/25/16  1442     History   Chief Complaint Chief Complaint  Patient presents with  . Back Pain  . Flank Pain    HPI [name] is a 40 y.o. male the past medical history of chronic back pain, seizures, kidney stones who presents to the ED today complaining of left-sided flank pain. Patient states he began having left flank pain that wraps around to his left groin and into his left testicle onset 3-4 days ago. He is intermittent but states it feels like it is getting worse. He is had 2 episodes of nonbloody nonbilious emesis 2 days ago. Patient reports subjective fevers but states every time he takes his temperature he is afebrile. He's been taking his home prescribed Percocet without relief of his pain. Patient states this feels like his previous kidney stones. Patient states he has had multiple kidney stones in the past and he has not been able to pass on his own. He also states that he failed lithotripsy and required "laser treatment". Patient states his last kidney symptoms in 2010 and was done by [facility] in [place]. He denies any dysuria or hematuria but states it has been more difficult to begin urinating over the last couple of days.   HPI  Past Medical History:  Diagnosis Date  . Allergy   . Anxiety   . Asthma   . Bulging lumbar disc   . GERD (gastroesophageal reflux disease)   . Headache(784.0)   . Kidney stones, mixed calcium oxalate   . Seizures ([place])   . Syncope and collapse     Patient Active Problem List   Diagnosis Date Noted  . Chronic back pain 11/10/2014  . Seizures ([place]) 10/12/2013  . Anxiety state, unspecified 10/12/2013    Past Surgical History:  Procedure Laterality Date  . APPENDECTOMY    . CERVICAL DISC ARTHROPLASTY    . CHOLECYSTECTOMY    . CYSTOSCOPY W/ URETERAL STENT PLACEMENT    . EXTRACORPOREAL SHOCK WAVE LITHOTRIPSY    . HERNIA REPAIR    .  TONSILLECTOMY         Home Medications    Prior to Admission medications   Medication Sig Start Date End Date Taking? Authorizing Provider  albuterol (PROVENTIL HFA;VENTOLIN HFA) 108 (90 BASE) MCG/ACT inhaler Inhale 1-2 puffs into the lungs every 4 (four) hours as needed for wheezing or shortness of breath. 03/11/15   Wendie Agreste, MD  ALPRAZolam Duanne Moron) 1 MG tablet TAKE 1 TABLET BY MOUTH TWICE DAILY AS NEEDED FOR ANXIETY 11/15/14   Gay Filler Copland, MD  dicyclomine (BENTYL) 20 MG tablet Take 0.5 tablets (10 mg total) by mouth every 8 (eight) hours as needed for spasms. 01/29/16 02/08/16  Courteney Lyn Mackuen, MD  ibuprofen (ADVIL,MOTRIN) 200 MG tablet Take 800 mg by mouth every 6 (six) hours as needed for mild pain.    Historical Provider, MD  Oxycodone HCl 10 MG TABS Take 10 mg by mouth 3 (three) times daily.    Historical Provider, MD  pantoprazole (PROTONIX) 40 MG tablet Take 1 tablet (40 mg total) by mouth daily. 02/14/16   Amy S Esterwood, PA-C  pantoprazole (PROTONIX) 40 MG tablet TAKE 1 TABLET BY MOUTH TWICE DAILY 20 TO 30 MINUTES PRIOR TO BREAKFAST AND AT BEDTIME 03/06/16   Milus Banister, MD    Family History Family History  Problem Relation Age of Onset  . Hyperlipidemia Mother   .  Lung cancer Maternal Uncle     Died at 64  . Lung cancer Maternal Grandmother   . Breast cancer Maternal Grandmother   . Cancer - Other Maternal Grandmother     Lip cancer  . COPD Maternal Grandfather   . Bladder Cancer Maternal Grandfather   . Heart disease Maternal Grandfather   . Kidney disease Maternal Grandfather   . Congestive Heart Failure Maternal Grandfather   . Arrhythmia Maternal Grandfather     AFib  . Other Father     Bleeding ulcers    Social History Social History  Substance Use Topics  . Smoking status: Current Every Day Smoker    Packs/day: 0.50    Types: Cigarettes  . Smokeless tobacco: Not on file  . Alcohol use No     Comment: social     Allergies     Codeine and Hydrocodone   Review of Systems Review of Systems  All other systems reviewed and are negative.    Physical Exam Updated Vital Signs BP 142/93 (BP Location: Right Arm)   Pulse 71   Temp 98 F (36.7 C) (Oral)   Resp 20   Ht 6\' 1"  (1.854 m)   Wt 96.6 kg   SpO2 96%   BMI 28.10 kg/m   Physical Exam  Constitutional: He is oriented to person, place, and time. He appears well-developed and well-nourished. No distress.  HENT:  Head: Normocephalic and atraumatic.  Mouth/Throat: No oropharyngeal exudate.  Eyes: Conjunctivae and EOM are normal. Pupils are equal, round, and reactive to light. Right eye exhibits no discharge. Left eye exhibits no discharge. No scleral icterus.  Cardiovascular: Normal rate, regular rhythm, normal heart sounds and intact distal pulses.  Exam reveals no gallop and no friction rub.   No murmur heard. Pulmonary/Chest: Effort normal and breath sounds normal. No respiratory distress. He has no wheezes. He has no rales. He exhibits no tenderness.  Abdominal: Soft. Bowel sounds are normal. He exhibits no distension and no mass. There is no tenderness. There is no rebound and no guarding. No hernia.  Left CVA tenderness  Musculoskeletal: Normal range of motion. He exhibits no edema.  Neurological: He is alert and oriented to person, place, and time.  Skin: Skin is warm and dry. No rash noted. He is not diaphoretic. No erythema. No pallor.  Psychiatric: He has a normal mood and affect. His behavior is normal.  Nursing note and vitals reviewed.    ED Treatments / Results  Labs (all labs ordered are listed, but only abnormal results are displayed) Labs Reviewed  URINALYSIS, ROUTINE W REFLEX MICROSCOPIC (NOT AT [facility]) - Abnormal; Notable for the following:       Result Value   Color, Urine AMBER (*)    Hgb urine dipstick SMALL (*)    Ketones, ur 15 (*)    All other components within normal limits  URINE MICROSCOPIC-ADD ON - Abnormal; Notable for  the following:    Bacteria, UA RARE (*)    All other components within normal limits  URINE CULTURE  COMPREHENSIVE METABOLIC PANEL  CBC WITH DIFFERENTIAL/PLATELET    EKG  EKG Interpretation None       Radiology Ct Renal Stone Study  Result Date: 03/25/2016 CLINICAL DATA:  Initial evaluation for acute left flank pain. History of kidney stones. EXAM: CT ABDOMEN AND PELVIS WITHOUT CONTRAST TECHNIQUE: Multidetector CT imaging of the abdomen and pelvis was performed following the standard protocol without IV contrast. COMPARISON:  Prior CT from 09/16/2010.  FINDINGS: Lower chest: Mild bibasilar atelectatic changes present. Visualized lung bases are otherwise clear. Hepatobiliary: Liver demonstrates a normal unenhanced appearance. Gallbladder within normal limits. No biliary dilatation. Pancreas: Pancreas within normal limits. Spleen: Spleen within normal limits. Adrenals/Urinary Tract: Kidneys equal in size without evidence of hydronephrosis. Punctate nonobstructive 3 mm left renal calculus present. No radiopaque calculi seen along the course of either ureter. There is no hydroureter. Bladder within normal limits. No layering stones within the bladder lumen. Stomach/Bowel: Stomach within normal limits. No evidence for bowel obstruction. Mild colonic diverticulosis without evidence for acute diverticulitis. No acute inflammatory changes about the bowels. Vascular/Lymphatic: No acute vascular abnormality.  No adenopathy. Reproductive: Prostate within normal limits. Other: Small fat containing paraumbilical hernia noted. No free air or fluid. Musculoskeletal: No acute osseous abnormality. No worrisome lytic or blastic osseous lesions. IMPRESSION: 1. 3 mm nonobstructive left renal calculus. No CT evidence for obstructive uropathy. 2. No other acute intra-abdominal or pelvic process identified. Electronically Signed   By: Jeannine Boga M.D.   On: 03/25/2016 22:32    Procedures Procedures  (including critical care time)  Medications Ordered in ED Medications  oxyCODONE-acetaminophen (PERCOCET/ROXICET) 5-325 MG per tablet (not administered)  HYDROmorphone (DILAUDID) injection 1 mg (not administered)  oxyCODONE-acetaminophen (PERCOCET/ROXICET) 5-325 MG per tablet 1 tablet (1 tablet Oral Given 03/25/16 1452)     Initial Impression / Assessment and Plan / ED Course  I have reviewed the triage vital signs and the nursing notes.  Pertinent labs & imaging results that were available during my care of the patient were reviewed by me and considered in my medical decision making (see chart for details).  Clinical Course    40 year old male with history of chronic back pain presents the ED today complaining of severe left-sided flank pain that is colicky in nature and radiates into his left groin. Patient has significant history of multiple kidney stones and states this feels similar. Patient appears uncomfortable in the ED was otherwise in no apparent distress. He has left-sided CVA tenderness. Small hemoglobin urine. But no sign of infection. All of the labwork unremarkable. CT does reveal small 3 mm nonobstructive left renal calculus. No sign of obstructive uropathy or hydronephrosis. Pain adequately managed in the ED. Will DC home with pain medication and Flomax. Recommend urology follow-up.  Final Clinical Impressions(s) / ED Diagnoses   Final diagnoses:  Kidney stone  Left flank pain    New Prescriptions New Prescriptions   No medications on file     Carlos Levering, PA-C 03/27/16 0024    Fredia Sorrow, MD 03/28/16 [phone]

## 2016-03-25 NOTE — ED Triage Notes (Signed)
Pt sts left sided flank pain and back pain x 2 days; pt sts hx of kidney stones and feels same

## 2016-03-27 LAB — URINE CULTURE: Culture: NO GROWTH

## 2016-04-09 ENCOUNTER — Ambulatory Visit (HOSPITAL_COMMUNITY)
Admission: EM | Admit: 2016-04-09 | Discharge: 2016-04-09 | Disposition: A | Discharge status: Home / Self Care | Payer: Self-pay | Attending: Emergency Medicine | Admitting: Emergency Medicine

## 2016-04-09 ENCOUNTER — Ambulatory Visit (INDEPENDENT_AMBULATORY_CARE_PROVIDER_SITE_OTHER): Payer: Self-pay

## 2016-04-09 ENCOUNTER — Encounter (HOSPITAL_COMMUNITY): Payer: Self-pay | Admitting: Emergency Medicine

## 2016-04-09 DIAGNOSIS — S93491A Sprain of other ligament of right ankle, initial encounter: Secondary | ICD-10-CM

## 2016-04-09 DIAGNOSIS — M79671 Pain in right foot: Secondary | ICD-10-CM

## 2016-04-09 MED ORDER — NAPROXEN 500 MG PO TABS: 500.0000 mg | ORAL_TABLET | Freq: Two times a day (BID) | ORAL | 0 refills | Status: DC | PRN

## 2016-04-09 NOTE — ED Provider Notes (Signed)
CSN: [id number]     Arrival date & time 10/[place]/17  1653 History   First MD Initiated Contact with Patient 10/[place]/17 1740     Chief Complaint  Patient presents with  . Foot Pain   (Consider location/radiation/quality/duration/timing/severity/associated sxs/prior Treatment) 40 year old [name] presents with right foot pain. He was playing basketball with his son last night when he rolled his right ankle inward and heard a "pop". He went home and elevated his foot and applied ice. This morning, he was not able to put any weight on his right foot and is now swollen and more painful. He took Percocet last night which is helping with the pain.       Past Medical History:  Diagnosis Date  . Allergy   . Anxiety   . Asthma   . Bulging lumbar disc   . GERD (gastroesophageal reflux disease)   . Headache(784.0)   . Kidney stones, mixed calcium oxalate   . Seizures ([place])   . Syncope and collapse    Past Surgical History:  Procedure Laterality Date  . APPENDECTOMY    . CERVICAL DISC ARTHROPLASTY    . CHOLECYSTECTOMY    . CYSTOSCOPY W/ URETERAL STENT PLACEMENT    . EXTRACORPOREAL SHOCK WAVE LITHOTRIPSY    . HERNIA REPAIR    . TONSILLECTOMY     Family History  Problem Relation Age of Onset  . Hyperlipidemia Mother   . Lung cancer Maternal Uncle     Died at 9  . Lung cancer Maternal Grandmother   . Breast cancer Maternal Grandmother   . Cancer - Other Maternal Grandmother     Lip cancer  . COPD Maternal Grandfather   . Bladder Cancer Maternal Grandfather   . Heart disease Maternal Grandfather   . Kidney disease Maternal Grandfather   . Congestive Heart Failure Maternal Grandfather   . Arrhythmia Maternal Grandfather     AFib  . Other Father     Bleeding ulcers   Social History  Substance Use Topics  . Smoking status: Current Every Day Smoker    Packs/day: 0.50    Types: Cigarettes  . Smokeless tobacco: Never Used  . Alcohol use No     Comment: social    Review of Systems    Constitutional: Negative for chills and fever.  Musculoskeletal: Positive for arthralgias, gait problem and joint swelling.  Skin: Negative for rash and wound.  Neurological: Negative for dizziness, weakness, light-headedness and numbness.  Hematological: Does not bruise/bleed easily.  Psychiatric/Behavioral: The patient is nervous/anxious.     Allergies  Codeine; Hydrocodone; Lactose intolerance (gi); and Tomato  Home Medications   Prior to Admission medications   Medication Sig Start Date End Date Taking? Authorizing Provider  ALPRAZolam (XANAX) 1 MG tablet TAKE 1 TABLET BY MOUTH TWICE DAILY AS NEEDED FOR ANXIETY Patient taking differently: Take 1 mg by mouth two times a day as needed for anxiety 11/15/14  Yes Gay Filler Copland, MD  Oxycodone HCl 10 MG TABS Take 10 mg by mouth 5 (five) times daily as needed (for pain).    Yes Historical Provider, MD  pantoprazole (PROTONIX) 40 MG tablet Take 1 tablet (40 mg total) by mouth daily. Patient taking differently: Take 40 mg by mouth 2 (two) times daily.  02/14/16  Yes Amy S Esterwood, PA-C  acetaminophen (TYLENOL) 325 MG tablet Take 325-650 mg by mouth every 6 (six) hours as needed for headache.    Historical Provider, MD  albuterol (PROVENTIL HFA;VENTOLIN HFA) 108 (  90 BASE) MCG/ACT inhaler Inhale 1-2 puffs into the lungs every 4 (four) hours as needed for wheezing or shortness of breath. 9/[place]/16   Wendie Agreste, MD  naproxen (NAPROSYN) 500 MG tablet Take 1 tablet (500 mg total) by mouth 2 (two) times daily as needed for moderate pain. 10/[place]/17   Katy Apo, NP  oxyCODONE-acetaminophen (PERCOCET/ROXICET) 5-325 MG tablet Take 2 tablets by mouth every 4 (four) hours as needed for severe pain. 03/25/16   Samantha Tripp Dowless, PA-C  tamsulosin (FLOMAX) 0.4 MG CAPS capsule Take 1 capsule (0.4 mg total) by mouth daily. 03/25/16   Samantha Tripp Dowless, PA-C   Meds Ordered and Administered this Visit  Medications - No data to display  BP  (!) 141/102 (BP Location: Right Arm)   Pulse 71   Temp 98.8 F (37.1 C) (Oral)   Resp 18   SpO2 98%  No data found.   Physical Exam  Constitutional: He is oriented to person, place, and time. He appears well-developed and well-nourished. No distress.  Patient is resting comfortably in a wheelchair.  Cardiovascular: Normal rate and regular rhythm.   Musculoskeletal: He exhibits edema and tenderness.       Right foot: There is decreased range of motion, tenderness and swelling. There is normal capillary refill, no crepitus and no laceration.       Feet:  Right lateral aspect of foot swollen with some bruising present from mid  4th and 5th metatarsal area to lateral malleolus. Very tender and has decreased range of motion especially with extension. Good pulses and capillary refill. No neuro deficits noted.   Neurological: He is alert and oriented to person, place, and time. He has normal strength. No sensory deficit.  Skin: Skin is warm and dry. Capillary refill takes less than 2 seconds. No erythema.  Psychiatric: His speech is normal and behavior is normal. Judgment and thought content normal. His mood appears anxious. Cognition and memory are normal.    Urgent Care Course   Clinical Course    Procedures (including critical care time)  Labs Review Labs Reviewed - No data to display  Imaging Review Dg Foot Complete Right  Result Date: 10/[place]/2017 CLINICAL DATA:  Twisting injury to the foot with pain, initial encounter EXAM: RIGHT FOOT COMPLETE - 3+ VIEW COMPARISON:  None. FINDINGS: There is no evidence of fracture or dislocation. There is no evidence of arthropathy or other focal bone abnormality. Soft tissues are unremarkable. IMPRESSION: No acute abnormality noted. Electronically Signed   By: Inez Catalina M.D.   On: 10/[place]/2017 18:28     Visual Acuity Review  Right Eye Distance:   Left Eye Distance:   Bilateral Distance:    Right Eye Near:   Left Eye Near:    Bilateral  Near:         MDM   1. Foot pain, right   2. Sprain of anterior talofibular ligament of right ankle, initial encounter    Reviewed negative x-ray results with patient and spouse. Recommend elevated foot. Wear ankle brace as directed for support. May use crutches as well for support. Take Naproxen 500mg  twice a day as needed for swelling. May take Percocet that he has at home as needed for pain. Discussed that blood pressure is probably elevated due to anxiety and pain- recommend monitor BP over the next week.  Follow-up in 4 to 5 days if pain and swelling is not improving.      Katy Apo, NP 04/10/16 [phone]  Katy Apo, NP 04/10/16 [phone]

## 2016-04-09 NOTE — ED Triage Notes (Signed)
The patient presented to the [facility] with a complaint of right foot pain that started last night. The patient stated that he was playing basketball last night and rolled his right foot. The patient complained of pain to the lateral side of his right foot.

## 2016-04-09 NOTE — ED Notes (Signed)
Patient rolled right ankle, outward.  Incident occurred last night while playing basketball.  Patient rolled ankle and heard a pop.  Patient has 2 +pedal pulses in right foot.  Reports numb toes.

## 2016-04-09 NOTE — Discharge Instructions (Signed)
Recommend use ankle brace for support. Take Naproxen twice a day as needed for pain. Elevate foot as much as possible. Use crutched for support. Follow-up with your primary care provider if not improving within 5 days.

## 2016-04-18 ENCOUNTER — Other Ambulatory Visit: Payer: Self-pay | Admitting: Gastroenterology

## 2016-04-18 DIAGNOSIS — R1013 Epigastric pain: Secondary | ICD-10-CM

## 2016-05-25 ENCOUNTER — Other Ambulatory Visit: Payer: Self-pay | Admitting: Gastroenterology

## 2016-05-25 DIAGNOSIS — R1013 Epigastric pain: Secondary | ICD-10-CM

## 2016-06-05 ENCOUNTER — Encounter (HOSPITAL_COMMUNITY): Payer: Self-pay | Admitting: Emergency Medicine

## 2016-06-05 ENCOUNTER — Emergency Department (HOSPITAL_COMMUNITY)
Admission: EM | Admit: 2016-06-05 | Discharge: 2016-06-05 | Disposition: A | Discharge status: Home / Self Care | Payer: Self-pay | Attending: Emergency Medicine | Admitting: Emergency Medicine

## 2016-06-05 DIAGNOSIS — F17210 Nicotine dependence, cigarettes, uncomplicated: Secondary | ICD-10-CM | POA: Insufficient documentation

## 2016-06-05 DIAGNOSIS — R112 Nausea with vomiting, unspecified: Secondary | ICD-10-CM | POA: Insufficient documentation

## 2016-06-05 DIAGNOSIS — R197 Diarrhea, unspecified: Secondary | ICD-10-CM | POA: Insufficient documentation

## 2016-06-05 DIAGNOSIS — J45909 Unspecified asthma, uncomplicated: Secondary | ICD-10-CM | POA: Insufficient documentation

## 2016-06-05 LAB — COMPREHENSIVE METABOLIC PANEL
ALT: 21 U/L (ref 17–63)
ANION GAP: 8 (ref 5–15)
AST: 19 U/L (ref 15–41)
Albumin: 3.8 g/dL (ref 3.5–5.0)
Alkaline Phosphatase: 65 U/L (ref 38–126)
BUN: 17 mg/dL (ref 6–20)
CALCIUM: 8.4 mg/dL — AB (ref 8.9–10.3)
CHLORIDE: 103 mmol/L (ref 101–111)
CO2: 28 mmol/L (ref 22–32)
Creatinine, Ser: 1.16 mg/dL (ref 0.61–1.24)
GFR calc non Af Amer: >60 mL/min (ref >60)
Glucose, Bld: 80 mg/dL (ref 65–99)
Potassium: 3.2 mmol/L — ABNORMAL LOW (ref 3.5–5.1)
SODIUM: 139 mmol/L (ref 135–145)
Total Bilirubin: 0.4 mg/dL (ref 0.3–1.2)
Total Protein: 6.7 g/dL (ref 6.5–8.1)

## 2016-06-05 LAB — CBC
HCT: 44.9 % (ref 39.0–52.0)
HEMOGLOBIN: 15.3 g/dL (ref 13.0–17.0)
MCH: 31 pg (ref 26.0–34.0)
MCHC: 34.1 g/dL (ref 30.0–36.0)
MCV: 91.1 fL (ref 78.0–100.0)
Platelets: 252 10*3/uL (ref 150–400)
RBC: 4.93 MIL/uL (ref 4.22–5.81)
RDW: 13.8 % (ref 11.5–15.5)
WBC: 7.3 10*3/uL (ref 4.0–10.5)

## 2016-06-05 LAB — URINALYSIS, ROUTINE W REFLEX MICROSCOPIC
BILIRUBIN URINE: NEGATIVE
Glucose, UA: 50 mg/dL — AB
Hgb urine dipstick: NEGATIVE
Ketones, ur: NEGATIVE mg/dL
Leukocytes, UA: NEGATIVE
NITRITE: NEGATIVE
PH: 6 (ref 5.0–8.0)
Protein, ur: NEGATIVE mg/dL
SPECIFIC GRAVITY, URINE: 1.011 (ref 1.005–1.030)

## 2016-06-05 LAB — LIPASE, BLOOD: LIPASE: 30 U/L (ref 11–51)

## 2016-06-05 MED ORDER — METOCLOPRAMIDE HCL 10 MG PO TABS: 10.0000 mg | ORAL_TABLET | Freq: Four times a day (QID) | ORAL | 0 refills | Status: AC | PRN

## 2016-06-05 MED ORDER — SODIUM CHLORIDE 0.9 % IV BOLUS (SEPSIS): 1000.0000 mL | Freq: Once | INTRAVENOUS | Status: DC

## 2016-06-05 MED ORDER — ONDANSETRON HCL 4 MG/2ML IJ SOLN: 4.0000 mg | Freq: Once | INTRAMUSCULAR | Status: DC

## 2016-06-05 MED ORDER — PROCHLORPERAZINE EDISYLATE 5 MG/ML IJ SOLN
10.0000 mg | Freq: Once | INTRAMUSCULAR | Status: AC
  Administered 2016-06-05: 10 mg via INTRAMUSCULAR
  Filled 2016-06-05: qty 2

## 2016-06-05 NOTE — ED Provider Notes (Signed)
[facility] DEPT Provider Note   CSN: [id number] Arrival date & time: 06/05/16  2044  By signing my name below, I, Jeanell Sparrow, attest that this documentation has been prepared under the direction and in the presence of Delora Fuel, MD . Electronically Signed: Jeanell Sparrow, Scribe. 06/05/2016. 11:19 PM.  History   Chief Complaint Chief Complaint  Patient presents with  . Emesis  . Abdominal Pain   The history is provided by the patient. No language interpreter was used.    HPI Comments: [name] is a 40 y.o. male who presents to the Emergency Department complaining of episodic vomiting that started 3 days ago. He describes the vomiting as projectile and occurring multiple times daily. He reports that eating exacerbates the vomiting and is unrelieved by zofran. He reports associated symptoms of diarrhea, chills, diaphoresis, and abdominal pain. He describes the mid-abdominal pain as waxing/waning and currently rates it as a 3/10. He admits to having sick contacts. He denies any fever or other complaints.   Past Medical History:  Diagnosis Date  . Allergy   . Anxiety   . Asthma   . Bulging lumbar disc   . GERD (gastroesophageal reflux disease)   . Headache(784.0)   . Kidney stones, mixed calcium oxalate   . Seizures ([place])   . Syncope and collapse     Patient Active Problem List   Diagnosis Date Noted  . Chronic back pain 11/10/2014  . Seizures ([place]) 10/12/2013  . Anxiety state, unspecified 10/12/2013    Past Surgical History:  Procedure Laterality Date  . APPENDECTOMY    . CERVICAL DISC ARTHROPLASTY    . CHOLECYSTECTOMY    . CYSTOSCOPY W/ URETERAL STENT PLACEMENT    . EXTRACORPOREAL SHOCK WAVE LITHOTRIPSY    . HERNIA REPAIR    . TONSILLECTOMY         Home Medications    Prior to Admission medications   Medication Sig Start Date End Date Taking? Authorizing Provider  albuterol (PROVENTIL HFA;VENTOLIN HFA) 108 (90 BASE) MCG/ACT inhaler Inhale 1-2 puffs  into the lungs every 4 (four) hours as needed for wheezing or shortness of breath. 03/11/15  Yes Wendie Agreste, MD  ALPRAZolam Duanne Moron) 1 MG tablet TAKE 1 TABLET BY MOUTH TWICE DAILY AS NEEDED FOR ANXIETY Patient taking differently: Take 1 mg by mouth two times a day as needed for anxiety 11/15/14  Yes Gay Filler Copland, MD  ibuprofen (ADVIL,MOTRIN) 200 MG tablet Take 400 mg by mouth every 6 (six) hours as needed for headache, mild pain or moderate pain.   Yes Historical Provider, MD  ondansetron (ZOFRAN-ODT) 4 MG disintegrating tablet Take 4 mg by mouth every 8 (eight) hours as needed for nausea or vomiting.   Yes Historical Provider, MD  Oxycodone HCl 10 MG TABS Take 10 mg by mouth 5 (five) times daily as needed (for pain).    Yes Historical Provider, MD  pantoprazole (PROTONIX) 40 MG tablet Take 1 tablet (40 mg total) by mouth daily. Patient taking differently: Take 40 mg by mouth 2 (two) times daily.  02/14/16  Yes Amy S Esterwood, PA-C  metoCLOPramide (REGLAN) 10 MG tablet Take 1 tablet (10 mg total) by mouth every 6 (six) hours as needed for nausea. [id number]   Delora Fuel, MD    Family History Family History  Problem Relation Age of Onset  . Hyperlipidemia Mother   . Lung cancer Maternal Uncle     Died at 26  . Lung cancer Maternal Grandmother   .  Breast cancer Maternal Grandmother   . Cancer - Other Maternal Grandmother     Lip cancer  . COPD Maternal Grandfather   . Bladder Cancer Maternal Grandfather   . Heart disease Maternal Grandfather   . Kidney disease Maternal Grandfather   . Congestive Heart Failure Maternal Grandfather   . Arrhythmia Maternal Grandfather     AFib  . Other Father     Bleeding ulcers    Social History Social History  Substance Use Topics  . Smoking status: Current Every Day Smoker    Packs/day: 0.50    Types: Cigarettes  . Smokeless tobacco: Never Used  . Alcohol use No     Comment: social     Allergies   Codeine; Hydrocodone; Lactose  intolerance (gi); and Tomato   Review of Systems Review of Systems  Constitutional: Positive for chills. Negative for fever.  Gastrointestinal: Positive for abdominal pain, diarrhea and vomiting.  All other systems reviewed and are negative.    Physical Exam Updated Vital Signs BP 136/86 (BP Location: Left Arm)   Pulse (!) 56   Temp 98.9 F (37.2 C) (Oral)   Resp 16   Ht 6\' 1"  (1.854 m)   Wt 231 lb 3.2 oz (104.9 kg)   SpO2 96%   BMI 30.50 kg/m   Physical Exam  Constitutional: He is oriented to person, place, and time. He appears well-developed and well-nourished.  HENT:  Head: Normocephalic and atraumatic.  Eyes: EOM are normal. Pupils are equal, round, and reactive to light.  Neck: Normal range of motion. Neck supple. No JVD present.  Cardiovascular: Normal rate, regular rhythm and normal heart sounds.   No murmur heard. Pulmonary/Chest: Effort normal and breath sounds normal. He has no wheezes. He has no rales. He exhibits no tenderness.  Abdominal: Soft. He exhibits no distension and no mass. There is tenderness. There is no rebound and no guarding.  Mild periumbilical TTP. No rebound or guarding. Bowel sounds decreased.   Musculoskeletal: Normal range of motion. He exhibits no edema.  Lymphadenopathy:    He has no cervical adenopathy.  Neurological: He is alert and oriented to person, place, and time. No cranial nerve deficit. He exhibits normal muscle tone. Coordination normal.  Skin: Skin is warm and dry. No rash noted.  Psychiatric: He has a normal mood and affect. His behavior is normal. Judgment and thought content normal.  Nursing note and vitals reviewed.    ED Treatments / Results  DIAGNOSTIC STUDIES: Oxygen Saturation is 96% on RA, normal by my interpretation.    COORDINATION OF CARE: 11:26 PM- Pt advised of plan for treatment and pt agrees.  Labs (all labs ordered are listed, but only abnormal results are displayed) Labs Reviewed  COMPREHENSIVE  METABOLIC PANEL - Abnormal; Notable for the following:       Result Value   Potassium 3.2 (*)    Calcium 8.4 (*)    All other components within normal limits  URINALYSIS, ROUTINE W REFLEX MICROSCOPIC - Abnormal; Notable for the following:    Color, Urine STRAW (*)    Glucose, UA 50 (*)    All other components within normal limits  LIPASE, BLOOD  CBC    Procedures Procedures (including critical care time)  Medications Ordered in ED Medications  prochlorperazine (COMPAZINE) injection 10 mg (not administered)     Initial Impression / Assessment and Plan / ED Course  I have reviewed the triage vital signs and the nursing notes.  Pertinent lab results that  were available during my care of the patient were reviewed by me and considered in my medical decision making (see chart for details).  Clinical Course    Nausea, vomiting, diarrhea most consistent with viral gastroenteritis. Laboratory workup is reassuring. Old records are reviewed, and he has prior ED visits for similar complaints. No red flags to suggest more serious pathology. He was offered IV fluids but stated he went to go home to rest. He is given an injection of prochlorperazine. Since he was not getting adequate relief from ondansetron at home, he is sent home with a prescription for metoclopramide. Advised to use loperamide as needed for diarrhea.  Final Clinical Impressions(s) / ED Diagnoses   Final diagnoses:  Nausea vomiting and diarrhea    New Prescriptions New Prescriptions   METOCLOPRAMIDE (REGLAN) 10 MG TABLET    Take 1 tablet (10 mg total) by mouth every 6 (six) hours as needed for nausea.   I personally performed the services described in this documentation, which was scribed in my presence. The recorded information has been reviewed and is accurate.       Delora Fuel, MD [id number]

## 2016-06-05 NOTE — ED Triage Notes (Signed)
Pt from home with complaints of emesis and upper middle abdominal pain since Monday. Pt states the pain has gotten slightly better since Monday. Pt states he has had 3 episodes of emesis today, last of which was " a few hours ago. Pt denies fever or chills. Pt is not tachycardic at time of assessment.

## 2016-06-05 NOTE — Discharge Instructions (Signed)
Take loperamide (Imodium ADO) as needed for diarrhea.

## 2016-06-26 ENCOUNTER — Other Ambulatory Visit: Payer: Self-pay | Admitting: Gastroenterology

## 2016-06-26 DIAGNOSIS — R1013 Epigastric pain: Secondary | ICD-10-CM

## 2017-08-10 ENCOUNTER — Emergency Department (HOSPITAL_COMMUNITY)
Admission: EM | Admit: 2017-08-10 | Discharge: 2017-08-10 | Disposition: A | Discharge status: Home / Self Care | Payer: Self-pay | Attending: Emergency Medicine | Admitting: Emergency Medicine

## 2017-08-10 ENCOUNTER — Encounter (HOSPITAL_COMMUNITY): Payer: Self-pay | Admitting: Emergency Medicine

## 2017-08-10 ENCOUNTER — Emergency Department (HOSPITAL_COMMUNITY): Payer: Self-pay

## 2017-08-10 DIAGNOSIS — R079 Chest pain, unspecified: Secondary | ICD-10-CM | POA: Insufficient documentation

## 2017-08-10 DIAGNOSIS — Z5321 Procedure and treatment not carried out due to patient leaving prior to being seen by health care provider: Secondary | ICD-10-CM | POA: Insufficient documentation

## 2017-08-10 LAB — BASIC METABOLIC PANEL
Anion gap: 13 (ref 5–15)
BUN: 18 mg/dL (ref 6–20)
CALCIUM: 9.5 mg/dL (ref 8.9–10.3)
CO2: 19 mmol/L — AB (ref 22–32)
CREATININE: 1.19 mg/dL (ref 0.61–1.24)
Chloride: 107 mmol/L (ref 101–111)
GFR calc Af Amer: >60 mL/min (ref >60)
GFR calc non Af Amer: >60 mL/min (ref >60)
GLUCOSE: 131 mg/dL — AB (ref 65–99)
Potassium: 3.4 mmol/L — ABNORMAL LOW (ref 3.5–5.1)
Sodium: 139 mmol/L (ref 135–145)

## 2017-08-10 LAB — I-STAT TROPONIN, ED: TROPONIN I, POC: 0 ng/mL (ref 0.00–0.08)

## 2017-08-10 LAB — CBC
HCT: 47 % (ref 39.0–52.0)
Hemoglobin: 16.6 g/dL (ref 13.0–17.0)
MCH: 32 pg (ref 26.0–34.0)
MCHC: 35.3 g/dL (ref 30.0–36.0)
MCV: 90.7 fL (ref 78.0–100.0)
PLATELETS: 270 10*3/uL (ref 150–400)
RBC: 5.18 MIL/uL (ref 4.22–5.81)
RDW: 13.4 % (ref 11.5–15.5)
WBC: 10.9 10*3/uL — ABNORMAL HIGH (ref 4.0–10.5)

## 2017-08-10 NOTE — ED Triage Notes (Signed)
Patient reports left sided chest pains that started about 30 mins ago when arguing with his step son. Reports arms feel tingling. Patient reports had this before in 2015 and told had mild heart attack and put on HTN but has since come off BP medications.

## 2017-08-10 NOTE — ED Notes (Signed)
Pt called for VS recheck with no response.  RN notified.

## 2017-08-10 NOTE — ED Notes (Signed)
No one responded when I called to recheck vitals

## 2018-03-17 ENCOUNTER — Ambulatory Visit (HOSPITAL_COMMUNITY)
Admission: EM | Admit: 2018-03-17 | Discharge: 2018-03-17 | Disposition: A | Discharge status: Home / Self Care | Payer: Self-pay | Attending: Family Medicine | Admitting: Family Medicine

## 2018-03-17 ENCOUNTER — Ambulatory Visit (INDEPENDENT_AMBULATORY_CARE_PROVIDER_SITE_OTHER): Payer: Self-pay

## 2018-03-17 ENCOUNTER — Encounter (HOSPITAL_COMMUNITY): Payer: Self-pay | Admitting: Emergency Medicine

## 2018-03-17 DIAGNOSIS — R05 Cough: Secondary | ICD-10-CM

## 2018-03-17 DIAGNOSIS — J209 Acute bronchitis, unspecified: Secondary | ICD-10-CM

## 2018-03-17 DIAGNOSIS — M2351 Chronic instability of knee, right knee: Secondary | ICD-10-CM

## 2018-03-17 DIAGNOSIS — R059 Cough, unspecified: Secondary | ICD-10-CM

## 2018-03-17 MED ORDER — ALBUTEROL SULFATE HFA 108 (90 BASE) MCG/ACT IN AERS
INHALATION_SPRAY | RESPIRATORY_TRACT | Status: AC
  Filled 2018-03-17: qty 6.7

## 2018-03-17 MED ORDER — BENZONATATE 100 MG PO CAPS: 100.0000 mg | ORAL_CAPSULE | Freq: Three times a day (TID) | ORAL | 0 refills | Status: DC

## 2018-03-17 MED ORDER — PREDNISONE 20 MG PO TABS: 20.0000 mg | ORAL_TABLET | Freq: Two times a day (BID) | ORAL | 0 refills | Status: AC

## 2018-03-17 MED ORDER — IPRATROPIUM-ALBUTEROL 0.5-2.5 (3) MG/3ML IN SOLN
3.0000 mL | Freq: Once | RESPIRATORY_TRACT | Status: AC
  Administered 2018-03-17: 3 mL via RESPIRATORY_TRACT

## 2018-03-17 MED ORDER — ALBUTEROL SULFATE HFA 108 (90 BASE) MCG/ACT IN AERS
2.0000 | INHALATION_SPRAY | Freq: Once | RESPIRATORY_TRACT | Status: AC
  Administered 2018-03-17: 2 via RESPIRATORY_TRACT

## 2018-03-17 MED ORDER — IPRATROPIUM-ALBUTEROL 0.5-2.5 (3) MG/3ML IN SOLN
RESPIRATORY_TRACT | Status: AC
  Filled 2018-03-17: qty 3

## 2018-03-17 NOTE — Discharge Instructions (Signed)
Breathing treatment given in office Inhaler given in office.  Use as needed for SOB or wheezing Chest x-ray did not show signs of pneumonia, but borderline enlarged heart Get plenty of rest and push fluids Use OTC medication as needed for symptomatic relief Prednisone prescribed.  Take as directed and to completion Prescribed tessolone perles as needed for cough Follow up with PCP or [facility] if symptoms persist Return or go to ER if you have any new or worsening symptoms   Continue conservative management of rest, ice, and gentle stretches Continue with OTC ibuprofen or tylenol as needed for pain or inflammation Follow up with PCP or [facility] if symptoms persist Present to ER if worsening or new symptoms (fever, chills, chest pain, abdominal pain, changes in bowel or bladder habits, pain radiating into lower legs, etc...)

## 2018-03-17 NOTE — ED Triage Notes (Signed)
Pt here for cough and URI sx  

## 2018-03-17 NOTE — ED Provider Notes (Addendum)
[place]   [id number] 03/17/18 Arrival Time: 6761  CC: cough and URI and right knee instability   SUBJECTIVE:  [name] is a 42 y.o. male hx of asthma and tobacco use, who presents with worsening cough x 2 weeks.  Admits to positive sick exposure to son.  Describes cough as worsening, intermittent and productive with thick brown sputum.  Has tried mucinex and zyrtec without relief.  Symptoms are made worse with laying down.  Reports previous symptoms in the past and diagnosed with PNA.  Complains of subjective fever, chills, fatigue, sinus pain/pressure, nasal congestion, SOB, and wheezing.   Denies rhinorrhea, sore throat, chest pain, nausea, changes in bowel or bladder habits.    Patient also mentions chronic right knee instability for the past several years.  Denies a precipitating event or trauma, but relates symptoms to playing sports when he was younger.  Has not tried OTC medications, but is on pain medication.  Worse with pivoting motions.    ROS: As per HPI.  Past Medical History:  Diagnosis Date  . Allergy   . Anxiety   . Asthma   . Bulging lumbar disc   . GERD (gastroesophageal reflux disease)   . Headache(784.0)   . Kidney stones, mixed calcium oxalate   . Seizures ([place])   . Syncope and collapse    Past Surgical History:  Procedure Laterality Date  . APPENDECTOMY    . CERVICAL DISC ARTHROPLASTY    . CHOLECYSTECTOMY    . CYSTOSCOPY W/ URETERAL STENT PLACEMENT    . EXTRACORPOREAL SHOCK WAVE LITHOTRIPSY    . HERNIA REPAIR    . TONSILLECTOMY     Allergies  Allergen Reactions  . Codeine Itching  . Hydrocodone Itching and Nausea Only  . Lactose Intolerance (Gi) Diarrhea and Nausea And Vomiting  . Tomato Diarrhea and Nausea And Vomiting    Has GERD and possible IBS and/or Crohn's disease (NO SPICY OR ACIDIC FOODS!!)   No current facility-administered medications on file prior to encounter.    Current Outpatient Medications on File Prior to  Encounter  Medication Sig Dispense Refill  . albuterol (PROVENTIL HFA;VENTOLIN HFA) 108 (90 BASE) MCG/ACT inhaler Inhale 1-2 puffs into the lungs every 4 (four) hours as needed for wheezing or shortness of breath. 1 Inhaler 0  . ALPRAZolam (XANAX) 1 MG tablet TAKE 1 TABLET BY MOUTH TWICE DAILY AS NEEDED FOR ANXIETY (Patient taking differently: Take 1 mg by mouth two times a day as needed for anxiety) 60 tablet 0  . ibuprofen (ADVIL,MOTRIN) 200 MG tablet Take 400 mg by mouth every 6 (six) hours as needed for headache, mild pain or moderate pain.    Marland Kitchen metoCLOPramide (REGLAN) 10 MG tablet Take 1 tablet (10 mg total) by mouth every 6 (six) hours as needed for nausea. (Patient not taking: Reported on 03/17/2018) 30 tablet 0  . ondansetron (ZOFRAN-ODT) 4 MG disintegrating tablet Take 4 mg by mouth every 8 (eight) hours as needed for nausea or vomiting.    . Oxycodone HCl 10 MG TABS Take 10 mg by mouth 5 (five) times daily as needed (for pain).     . pantoprazole (PROTONIX) 40 MG tablet Take 1 tablet (40 mg total) by mouth daily. (Patient taking differently: Take 40 mg by mouth 2 (two) times daily. ) 30 tablet 1    Social History   Socioeconomic History  . Marital status: Married    Spouse name: Not on file  . Number of children: Not  on file  . Years of education: Not on file  . Highest education level: Not on file  Occupational History  . Not on file  Social Needs  . Financial resource strain: Not on file  . Food insecurity:    Worry: Not on file    Inability: Not on file  . Transportation needs:    Medical: Not on file    Non-medical: Not on file  Tobacco Use  . Smoking status: Current Every Day Smoker    Packs/day: 0.50    Types: Cigarettes  . Smokeless tobacco: Never Used  Substance and Sexual Activity  . Alcohol use: No    Alcohol/week: 0.0 standard drinks    Comment: social  . Drug use: No  . Sexual activity: Not on file  Lifestyle  . Physical activity:    Days per week: Not on  file    Minutes per session: Not on file  . Stress: Not on file  Relationships  . Social connections:    Talks on phone: Not on file    Gets together: Not on file    Attends religious service: Not on file    Active member of club or organization: Not on file    Attends meetings of clubs or organizations: Not on file    Relationship status: Not on file  . Intimate partner violence:    Fear of current or ex partner: Not on file    Emotionally abused: Not on file    Physically abused: Not on file    Forced sexual activity: Not on file  Other Topics Concern  . Not on file  Social History Narrative  . Not on file   Family History  Problem Relation Age of Onset  . Hyperlipidemia Mother   . Lung cancer Maternal Uncle        Died at 38  . Lung cancer Maternal Grandmother   . Breast cancer Maternal Grandmother   . Cancer - Other Maternal Grandmother        Lip cancer  . COPD Maternal Grandfather   . Bladder Cancer Maternal Grandfather   . Heart disease Maternal Grandfather   . Kidney disease Maternal Grandfather   . Congestive Heart Failure Maternal Grandfather   . Arrhythmia Maternal Grandfather        AFib  . Other Father        Bleeding ulcers     OBJECTIVE:  Vitals:   03/17/18 1644  BP: (!) 136/96  Pulse: 62  Resp: 20  Temp: 98.3 F (36.8 C)  TempSrc: Oral  SpO2: 100%     General appearance: AOx3 in no acute distress; appears fatigued HEENT: Ears: EACs clear, TMs pearly gray; Eyes: PERRL.  EOM grossly intact.  Nose: nares erythematous without rhinorrhea; tonsils nonerythematous, uvula midline Neck: supple without LAD Lungs: Diffuse wheezes and rhonchi heard throughout bilateral lung fields; improved following duo-neb treatment Heart: regular rate and rhythm.  Radial pulses 2+ symmetrical bilaterally MSK: RT KNEE  Inspection: Skin clear and intact without obvious erythema, effusion, or ecchymosis.  Skin warm and dry to the touch  Palpation: tender about the  anteromedial aspect  ROM: FROM active and passive  Strength: 5/5 hip flexion, 5/5 knee abduction, 5/5 knee adduction, 5/5 knee flexion, 5/5 knee extension, 5/5 dorsiflexion, 5/5 plantar flexion  Stability: Anterior and posterior drawer intact  Sensation intact Skin: warm and dry Psychological: alert and cooperative; normal mood and affect  DIAGNOSTIC STUDIES:  Dg Chest 2 View  Result  Date: 03/17/2018 CLINICAL DATA:  Chest congestion, cough and wheezing for 2 weeks. History of recurrent bronchitis, pneumonia and asthma. EXAM: CHEST - 2 VIEW COMPARISON:  Chest radiograph August 10, 2017 FINDINGS: Cardiac silhouette is upper limits of normal in size, mediastinal silhouette is not suspicious. No pleural effusions or focal consolidations. Trachea projects midline and there is no pneumothorax. Soft tissue planes and included osseous structures are non-suspicious. IMPRESSION: Borderline cardiomegaly.  No acute pulmonary process. Electronically Signed   By: Elon Alas M.D.   On: 03/17/2018 18:27    ASSESSMENT & PLAN:  1. Cough   2. Acute bronchitis, unspecified organism   3. Chronic knee instability, right     Meds ordered this encounter  Medications  . ipratropium-albuterol (DUONEB) 0.5-2.5 (3) MG/3ML nebulizer solution 3 mL  . albuterol (PROVENTIL HFA;VENTOLIN HFA) 108 (90 Base) MCG/ACT inhaler 2 puff  . predniSONE (DELTASONE) 20 MG tablet    Sig: Take 1 tablet (20 mg total) by mouth 2 (two) times daily with a meal for 5 days.    Dispense:  10 tablet    Refill:  0    Order Specific Question:   Supervising Provider    Answer:   Wynona Luna [phone]  . benzonatate (TESSALON) 100 MG capsule    Sig: Take 1 capsule (100 mg total) by mouth every 8 (eight) hours.    Dispense:  21 capsule    Refill:  0    Order Specific Question:   Supervising Provider    Answer:   Wynona Luna [[id number]]   Breathing treatment given in office Inhaler given in office.  Use as needed  for SOB or wheezing Chest x-ray did not show signs of pneumonia, but borderline enlarged heart Get plenty of rest and push fluids Use OTC medication as needed for symptomatic relief Prednisone prescribed.  Take as directed and to completion Prescribed tessolone perles as needed for cough Follow up with PCP or [facility] if symptoms persist Return or go to ER if you have any new or worsening symptoms   Continue conservative management of rest, ice, and gentle stretches Continue with OTC ibuprofen or tylenol as needed for pain or inflammation Follow up with PCP or [facility] if symptoms persist Present to ER if worsening or new symptoms (fever, chills, chest pain, abdominal pain, changes in bowel or bladder habits, pain radiating into lower legs, etc...)  Reviewed expectations re: course of current medical issues. Questions answered. Outlined signs and symptoms indicating need for more acute intervention. Patient verbalized understanding. After Visit Summary given.     Lestine Box, PA-C 03/17/18 1934

## 2019-12-23 IMAGING — CR DG CHEST 2V
2 series · 2 of 2 positions shown · non-contrast
Comparison: 01/29/2016

CLINICAL DATA: Left-sided chest pain and shortness of breath for 30
minutes. History of asthma.

EXAM:
CHEST  2 VIEW

[w chest pa]
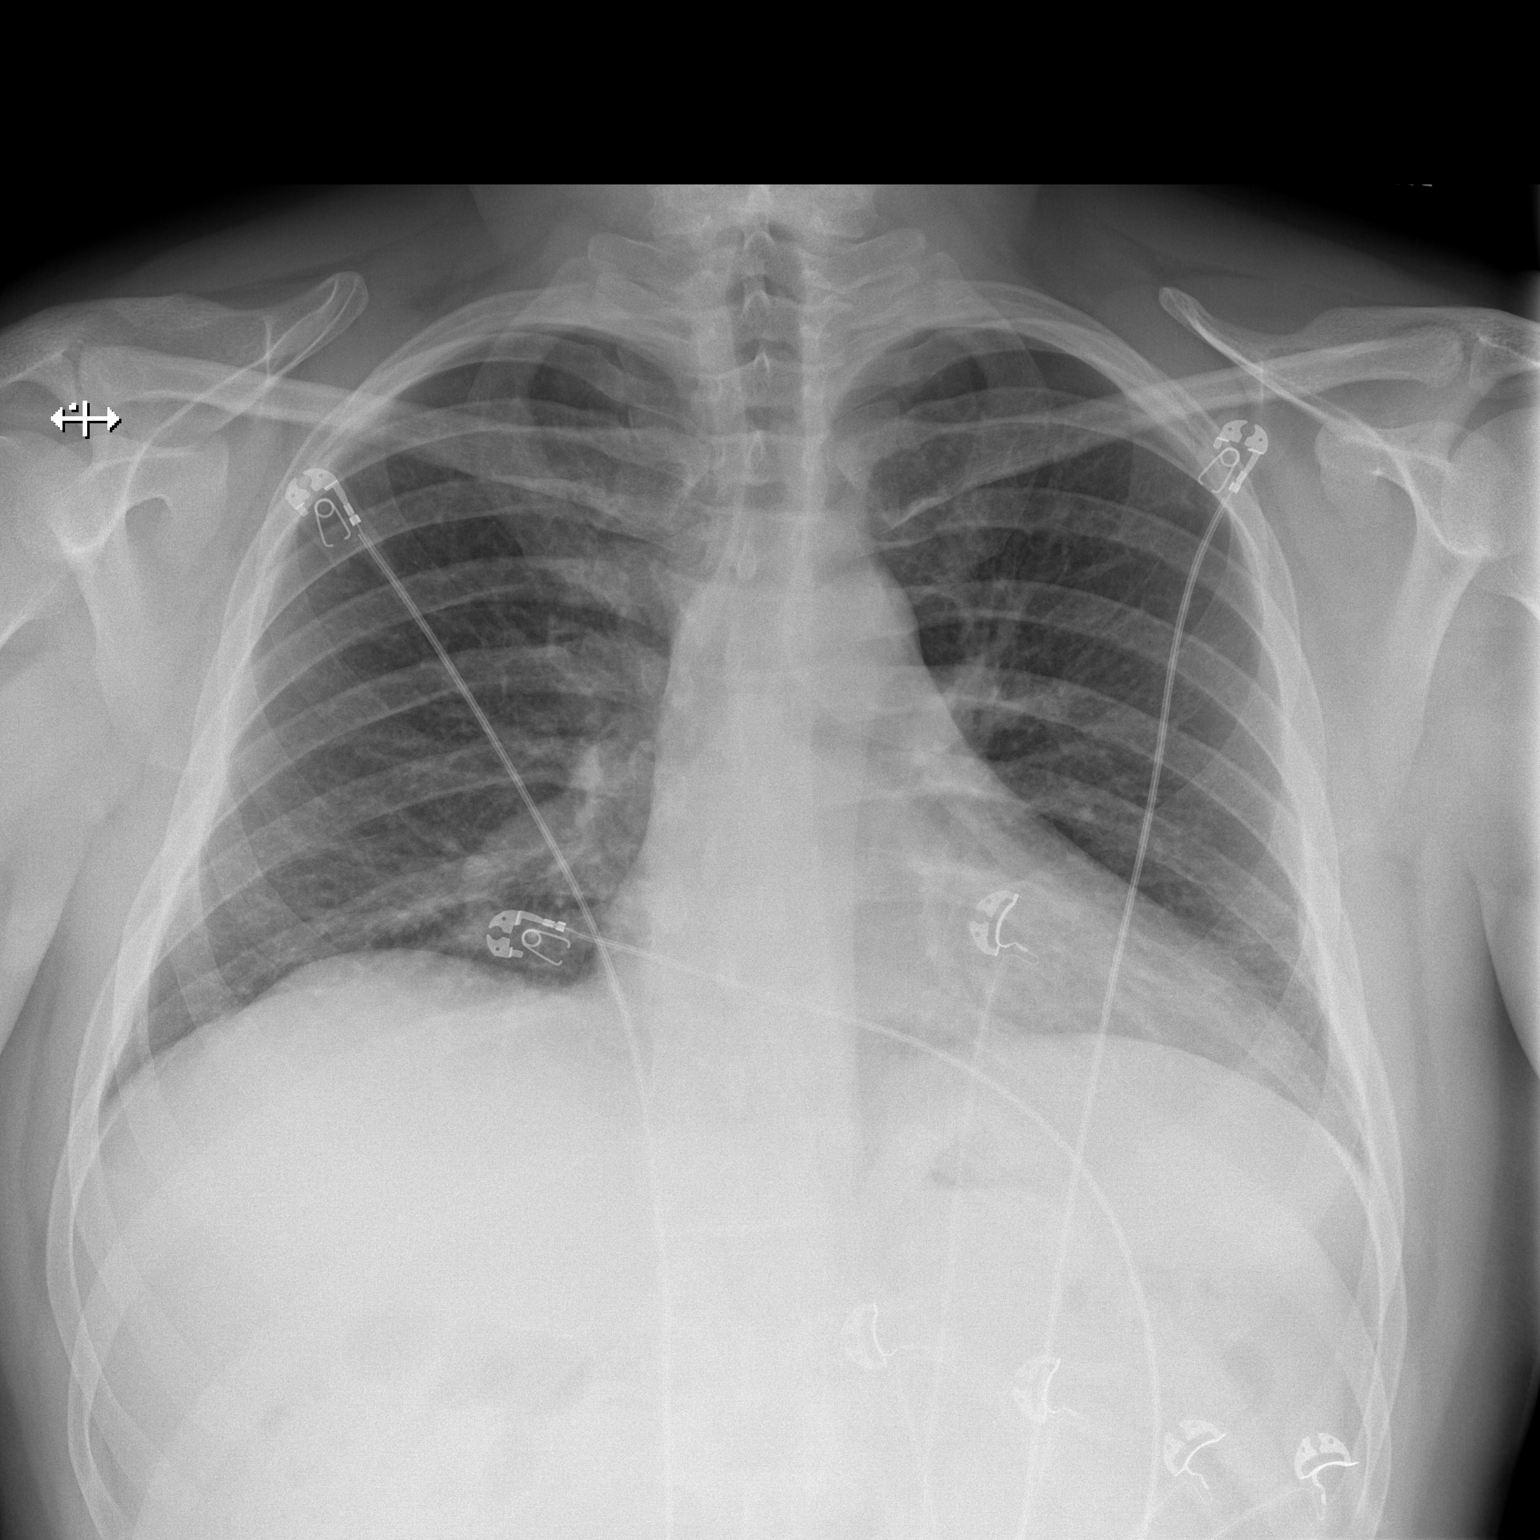

[w chest lat]
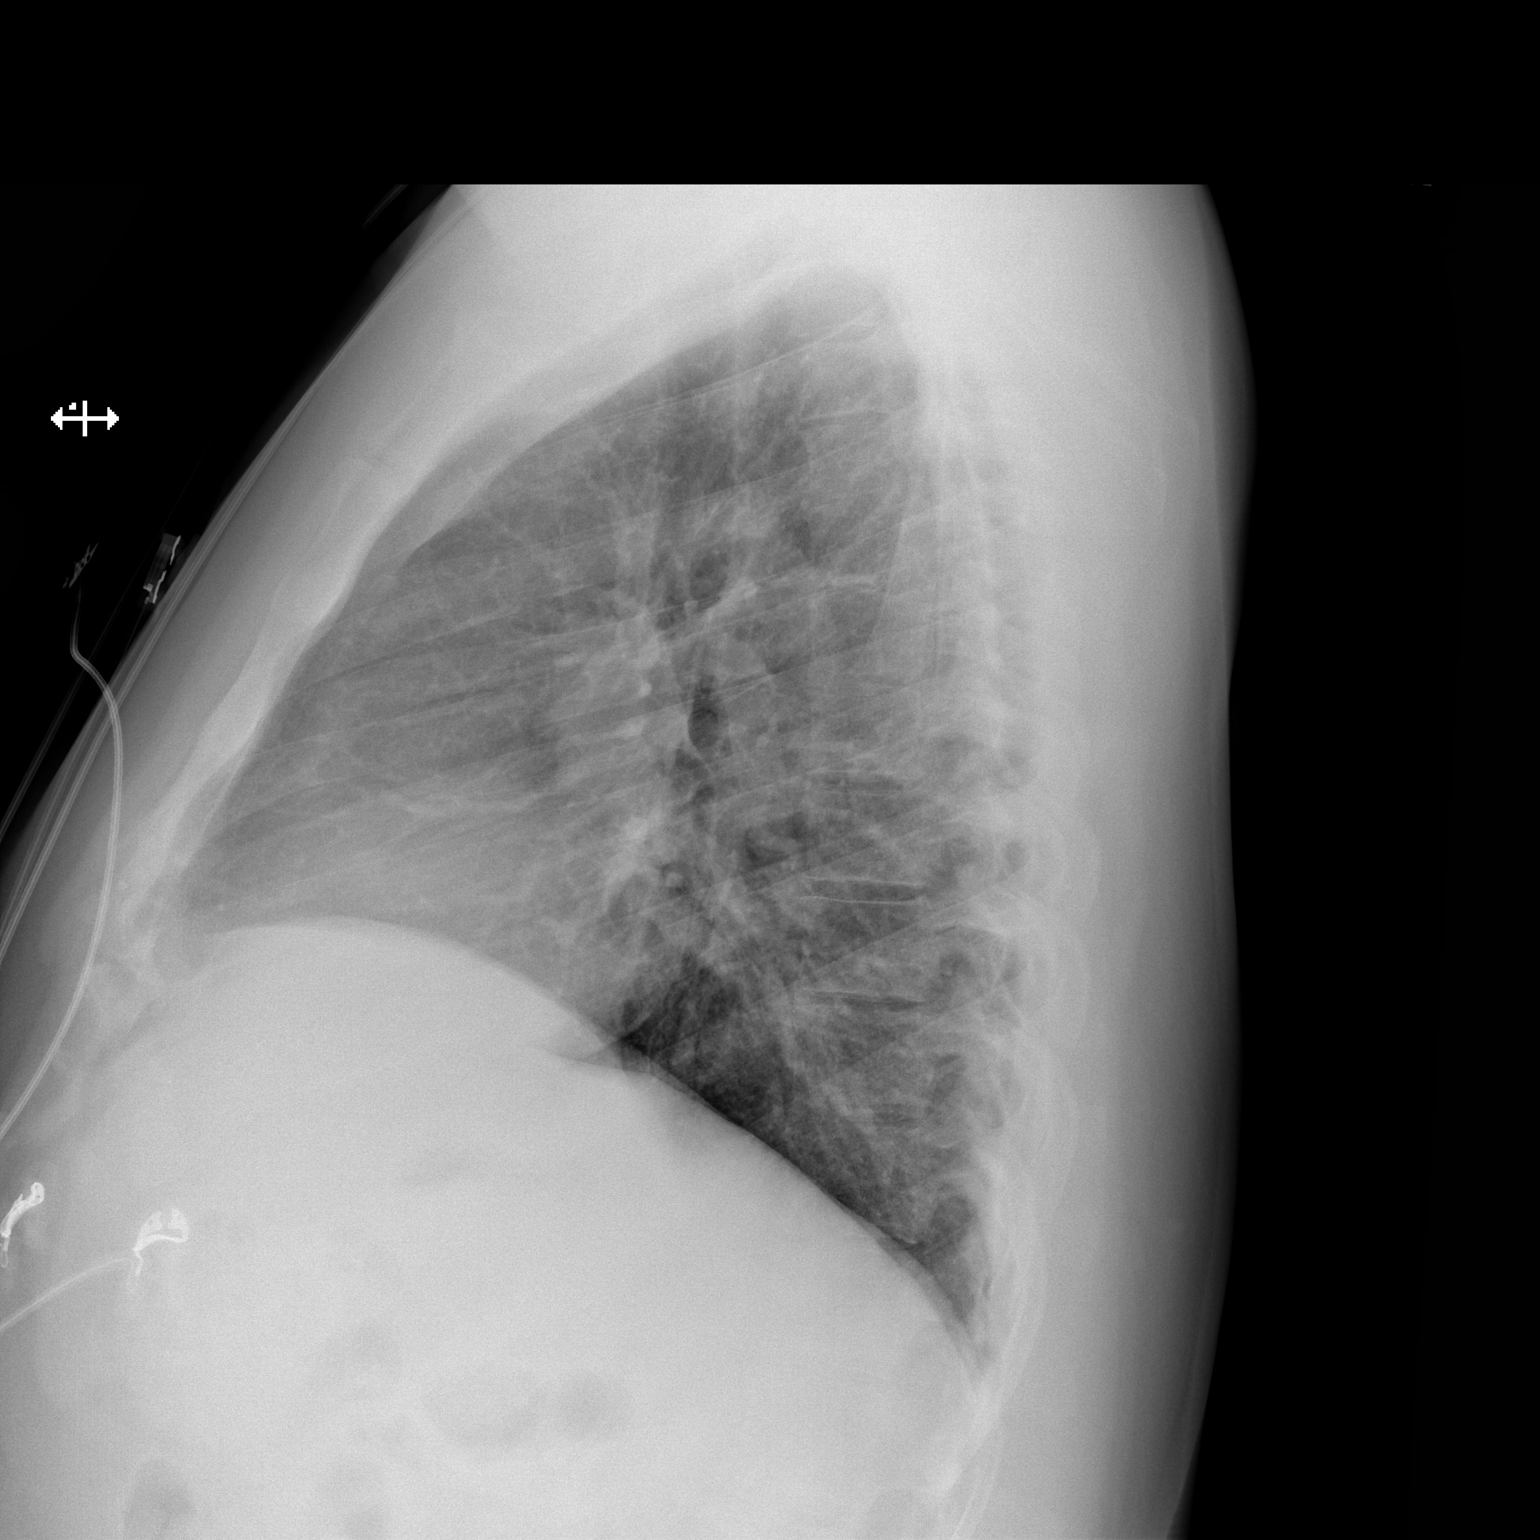

[2 of 2 positions shown; findings below may reference images not displayed]

FINDINGS: Midline trachea. Normal heart size and mediastinal contours. No
pleural effusion or pneumothorax. Clear lungs.
IMPRESSION: No acute cardiopulmonary disease.

## 2020-05-30 DIAGNOSIS — Z20828 Contact with and (suspected) exposure to other viral communicable diseases: Secondary | ICD-10-CM | POA: Diagnosis not present

## 2021-11-05 ENCOUNTER — Encounter: Payer: Self-pay | Admitting: Gastroenterology

## 2022-05-06 ENCOUNTER — Telehealth (HOSPITAL_COMMUNITY): Payer: Self-pay | Admitting: Emergency Medicine

## 2022-05-06 ENCOUNTER — Ambulatory Visit (HOSPITAL_COMMUNITY)
Admission: EM | Admit: 2022-05-06 | Discharge: 2022-05-06 | Disposition: A | Discharge status: Home / Self Care | Payer: Self-pay | Attending: Nurse Practitioner | Admitting: Nurse Practitioner

## 2022-05-06 ENCOUNTER — Encounter (HOSPITAL_COMMUNITY): Payer: Self-pay | Admitting: Emergency Medicine

## 2022-05-06 DIAGNOSIS — R220 Localized swelling, mass and lump, head: Secondary | ICD-10-CM

## 2022-05-06 DIAGNOSIS — K0889 Other specified disorders of teeth and supporting structures: Secondary | ICD-10-CM

## 2022-05-06 MED ORDER — AMOXICILLIN-POT CLAVULANATE 875-125 MG PO TABS: 1.0000 | ORAL_TABLET | Freq: Two times a day (BID) | ORAL | 0 refills | Status: AC

## 2022-05-06 MED ORDER — AMOXICILLIN-POT CLAVULANATE 875-125 MG PO TABS: 1.0000 | ORAL_TABLET | Freq: Two times a day (BID) | ORAL | 0 refills | Status: DC

## 2022-05-06 NOTE — Discharge Instructions (Signed)
I am concerned about the dental infection.  Please start the Augmentin and take it twice daily for 7 days.  If no improvement within 12 hours or if symptoms worsen, please go to the emergency room for more evaluation.  Use Tylenol/ibuprofen as needed for pain and use cool compresses every hour while awake 15 minutes on, 45 minutes off.

## 2022-05-06 NOTE — ED Provider Notes (Signed)
[place]    CSN: [id number] Arrival date & time: 05/06/22  1936      History   Chief Complaint Chief Complaint  Patient presents with   Dental Pain   Oral Swelling    HPI [name] is a 45 y.o. male.   Patient presents with wife for left facial swelling, left upper jaw pain that began suddenly on Sunday.  Reports a little while ago, one of his teeth broke off in his upper jaw, however it was not painful.  Reports sudden onset of swelling.  No fever, nausea/vomiting, diarrhea.  Reports appetite has been decreased and is difficult for him to eat today secondary to pain.  Reports his face is throbbing.  Has tried ice, take pain medication chronically for pain which has not helped.    Past Medical History:  Diagnosis Date   Allergy    Anxiety    Asthma    Bulging lumbar disc    GERD (gastroesophageal reflux disease)    Headache(784.0)    Kidney stones, mixed calcium oxalate    Seizures (HCC)    Syncope and collapse     Patient Active Problem List   Diagnosis Date Noted   Chronic back pain 11/10/2014   Seizures ([place]) 10/12/2013   Anxiety state, unspecified 10/12/2013    Past Surgical History:  Procedure Laterality Date   APPENDECTOMY     CERVICAL DISC ARTHROPLASTY     CHOLECYSTECTOMY     CYSTOSCOPY W/ URETERAL STENT PLACEMENT     EXTRACORPOREAL SHOCK WAVE LITHOTRIPSY     HERNIA REPAIR     TONSILLECTOMY         Home Medications    Prior to Admission medications   Medication Sig Start Date End Date Taking? Authorizing Provider  amoxicillin-clavulanate (AUGMENTIN) 875-125 MG tablet Take 1 tablet by mouth 2 (two) times daily for 7 days. 05/06/22 05/13/22 Yes Eulogio Bear, NP  albuterol (PROVENTIL HFA;VENTOLIN HFA) 108 (90 BASE) MCG/ACT inhaler Inhale 1-2 puffs into the lungs every 4 (four) hours as needed for wheezing or shortness of breath. 03/11/15   Wendie Agreste, MD  ALPRAZolam Duanne Moron) 1 MG tablet TAKE 1 TABLET BY MOUTH TWICE  DAILY AS NEEDED FOR ANXIETY Patient taking differently: Take 1 mg by mouth two times a day as needed for anxiety 11/15/14   Copland, Gay Filler, MD  ibuprofen (ADVIL,MOTRIN) 200 MG tablet Take 400 mg by mouth every 6 (six) hours as needed for headache, mild pain or moderate pain.    [provider]  metoCLOPramide (REGLAN) 10 MG tablet Take 1 tablet (10 mg total) by mouth every 6 (six) hours as needed for nausea. Patient not taking: Reported on 14/09/8183 63/14/97   Delora Fuel, MD  ondansetron (ZOFRAN-ODT) 4 MG disintegrating tablet Take 4 mg by mouth every 8 (eight) hours as needed for nausea or vomiting.    [provider]  Oxycodone HCl 10 MG TABS Take 10 mg by mouth 5 (five) times daily as needed (for pain).     [provider]  pantoprazole (PROTONIX) 40 MG tablet Take 1 tablet (40 mg total) by mouth daily. Patient taking differently: Take 40 mg by mouth 2 (two) times daily.  02/14/16   Esterwood, Amy S, PA-C    Family History Family History  Problem Relation Age of Onset   Hyperlipidemia Mother    Lung cancer Maternal Uncle        Died at 52   Lung cancer Maternal Grandmother  Breast cancer Maternal Grandmother    Cancer - Other Maternal Grandmother        Lip cancer   COPD Maternal Grandfather    Bladder Cancer Maternal Grandfather    Heart disease Maternal Grandfather    Kidney disease Maternal Grandfather    Congestive Heart Failure Maternal Grandfather    Arrhythmia Maternal Grandfather        AFib   Other Father        Bleeding ulcers    Social History Social History   Tobacco Use   Smoking status: Every Day    Packs/day: 0.50    Types: Cigarettes   Smokeless tobacco: Never  Substance Use Topics   Alcohol use: No    Alcohol/week: 0.0 standard drinks of alcohol    Comment: social   Drug use: No     Allergies   Codeine, Hydrocodone, Lactose intolerance (gi), and Tomato   Review of Systems Review of Systems Per HPI  Physical  Exam Triage Vital Signs ED Triage Vitals  Enc Vitals Group     BP 05/06/22 1950 128/84     Pulse Rate 05/06/22 1950 77     Resp 05/06/22 1950 16     Temp 05/06/22 1950 98.3 F (36.8 C)     Temp Source 05/06/22 1950 Oral     SpO2 05/06/22 1950 96 %     Weight --      Height --      Head Circumference --      Peak Flow --      Pain Score 05/06/22 1948 10     Pain Loc --      Pain Edu? --      Excl. in [place]? --    No data found.  Updated Vital Signs BP 128/84 (BP Location: Right Arm)   Pulse 77   Temp 98.3 F (36.8 C) (Oral)   Resp 16   SpO2 96%   Visual Acuity Right Eye Distance:   Left Eye Distance:   Bilateral Distance:    Right Eye Near:   Left Eye Near:    Bilateral Near:     Physical Exam Vitals and nursing note reviewed.  Constitutional:      General: He is not in acute distress.    Appearance: Normal appearance. He is not toxic-appearing.  HENT:     Head: Normocephalic and atraumatic.      Comments: Facial swelling to the area marked; no erythema, fluctuance, warmth.      Right Ear: Tympanic membrane, ear canal and external ear normal. There is no impacted cerumen.     Left Ear: Tympanic membrane, ear canal and external ear normal. There is no impacted cerumen.     Nose: Nose normal. No congestion or rhinorrhea.     Mouth/Throat:     Mouth: Mucous membranes are moist.     Dentition: Abnormal dentition. Dental tenderness and dental abscesses present.     Pharynx: Oropharynx is clear.      Comments: Broken tooth and swelling to the gum in area marked Eyes:     General: No scleral icterus.       Right eye: No discharge.        Left eye: No discharge.     Extraocular Movements: Extraocular movements intact.     Pupils: Pupils are equal, round, and reactive to light.  Cardiovascular:     Rate and Rhythm: Normal rate.  Pulmonary:     Effort: Pulmonary effort is  normal. No respiratory distress.  Musculoskeletal:     Cervical back: Normal range of  motion.  Lymphadenopathy:     Cervical: No cervical adenopathy.  Skin:    General: Skin is warm and dry.     Capillary Refill: Capillary refill takes less than 2 seconds.     Coloration: Skin is not jaundiced or pale.     Findings: No erythema.  Neurological:     Mental Status: He is alert and oriented to person, place, and time.  Psychiatric:        Behavior: Behavior is cooperative.      UC Treatments / Results  Labs (all labs ordered are listed, but only abnormal results are displayed) Labs Reviewed - No data to display  EKG   Radiology No results found.  Procedures Procedures (including critical care time)  Medications Ordered in UC Medications - No data to display  Initial Impression / Assessment and Plan / UC Course  I have reviewed the triage vital signs and the nursing notes.  Pertinent labs & imaging results that were available during my care of the patient were reviewed by me and considered in my medical decision making (see chart for details).   Patient is well-appearing, normotensive, afebrile, not tachycardic, not tachypneic, oxygenating well on room air.    Pain, dental Facial swelling On examination, there appears to be dental abscess and I am concerned given the amount of facial swelling Discussed with patient who desires trial of outpatient treatment Patient is well-appearing, vital signs are stable, he does not have systemic symptoms today Start Augmentin twice daily for 7 days Continue ice to help with swelling If symptoms worsen despite treatment or with no improvement, recommended follow-up in the emergency room for further evaluation and imaging Patient and wife are in agreement to this plan and verbalized understanding Note given for work today  The patient was given the opportunity to ask questions.  All questions answered to their satisfaction.  The patient is in agreement to this plan.  Final Clinical Impressions(s) / UC Diagnoses    Final diagnoses:  Pain, dental  Facial swelling     Discharge Instructions      I am concerned about the dental infection.  Please start the Augmentin and take it twice daily for 7 days.  If no improvement within 12 hours or if symptoms worsen, please go to the emergency room for more evaluation.  Use Tylenol/ibuprofen as needed for pain and use cool compresses every hour while awake 15 minutes on, 45 minutes off.    ED Prescriptions     Medication Sig Dispense Auth. Provider   amoxicillin-clavulanate (AUGMENTIN) 875-125 MG tablet Take 1 tablet by mouth 2 (two) times daily for 7 days. 14 tablet Eulogio Bear, NP      PDMP not reviewed this encounter.   Eulogio Bear, NP 05/06/22 2005

## 2022-05-06 NOTE — ED Triage Notes (Signed)
Pt reports dental pain on left side that started yesterday. Today having left side facial swelling that is getting worse as day progressed. Took ibuprofen and warm salt water rinse and ice pack.

## 2022-06-17 ENCOUNTER — Telehealth: Payer: Self-pay

## 2022-06-17 NOTE — Telephone Encounter (Signed)
Sending mychart msg. AS, CMA

## 2022-09-18 ENCOUNTER — Ambulatory Visit
Admission: EM | Admit: 2022-09-18 | Discharge: 2022-09-18 | Disposition: A | Discharge status: Home / Self Care | Payer: Medicaid Other | Attending: Internal Medicine | Admitting: Internal Medicine

## 2022-09-18 DIAGNOSIS — K0889 Other specified disorders of teeth and supporting structures: Secondary | ICD-10-CM

## 2022-09-18 DIAGNOSIS — K047 Periapical abscess without sinus: Secondary | ICD-10-CM

## 2022-09-18 MED ORDER — AMOXICILLIN-POT CLAVULANATE 875-125 MG PO TABS: 1.0000 | ORAL_TABLET | Freq: Two times a day (BID) | ORAL | 0 refills | Status: DC

## 2022-09-18 NOTE — ED Triage Notes (Signed)
Pt states dental pain since yesterday, has a cracked tooth on left upper side.  States he has been taking tylenol and motrin at home with no relief.

## 2022-09-18 NOTE — Discharge Instructions (Signed)
Antibiotic has been prescribed for dental infection.

## 2022-09-18 NOTE — ED Provider Notes (Signed)
[facility]    CSN: [id number] Arrival date & time: 09/18/22  1326      History   Chief Complaint Chief Complaint  Patient presents with   Dental Pain    HPI [name] is a 47 y.o. male.   Patient presents with left upper dental pain that started yesterday.  Patient reports he has a cracked tooth in that area which has not been bothering him until yesterday when he was eating some cereal and started having pain subsequently after that.  Has taken Tylenol, Motrin, his oxycodone with no improvement in pain.  Denies any associated fever.  Denies any recent trauma to the face.   Dental Pain   Past Medical History:  Diagnosis Date   Allergy    Anxiety    Asthma    Bulging lumbar disc    GERD (gastroesophageal reflux disease)    Headache(784.0)    Kidney stones, mixed calcium oxalate    Seizures    Syncope and collapse     Patient Active Problem List   Diagnosis Date Noted   Chronic back pain 11/10/2014   Seizures 10/12/2013   Anxiety state, unspecified 10/12/2013    Past Surgical History:  Procedure Laterality Date   APPENDECTOMY     CERVICAL DISC ARTHROPLASTY     CHOLECYSTECTOMY     CYSTOSCOPY W/ URETERAL STENT PLACEMENT     EXTRACORPOREAL SHOCK WAVE LITHOTRIPSY     HERNIA REPAIR     TONSILLECTOMY         Home Medications    Prior to Admission medications   Medication Sig Start Date End Date Taking? Authorizing Provider  amoxicillin-clavulanate (AUGMENTIN) 875-125 MG tablet Take 1 tablet by mouth every 12 (twelve) hours. 09/18/22  Yes Raenell Mensing, Michele Rockers, FNP  albuterol (PROVENTIL HFA;VENTOLIN HFA) 108 (90 BASE) MCG/ACT inhaler Inhale 1-2 puffs into the lungs every 4 (four) hours as needed for wheezing or shortness of breath. 03/11/15   Wendie Agreste, MD  ALPRAZolam Duanne Moron) 1 MG tablet TAKE 1 TABLET BY MOUTH TWICE DAILY AS NEEDED FOR ANXIETY Patient taking differently: Take 1 mg by mouth two times a day as needed for anxiety 11/15/14   Copland,  Gay Filler, MD  ibuprofen (ADVIL,MOTRIN) 200 MG tablet Take 400 mg by mouth every 6 (six) hours as needed for headache, mild pain or moderate pain.    [provider]  metoCLOPramide (REGLAN) 10 MG tablet Take 1 tablet (10 mg total) by mouth every 6 (six) hours as needed for nausea. Patient not taking: Reported on [id number]   Delora Fuel, MD  ondansetron (ZOFRAN-ODT) 4 MG disintegrating tablet Take 4 mg by mouth every 8 (eight) hours as needed for nausea or vomiting.    [provider]  Oxycodone HCl 10 MG TABS Take 10 mg by mouth 5 (five) times daily as needed (for pain).     [provider]  pantoprazole (PROTONIX) 40 MG tablet Take 1 tablet (40 mg total) by mouth daily. Patient taking differently: Take 40 mg by mouth 2 (two) times daily.  02/14/16   Esterwood, Amy Chauncey Cruel, PA-C    Family History Family History  Problem Relation Age of Onset   Hyperlipidemia Mother    Lung cancer Maternal Uncle        Died at 22   Lung cancer Maternal Grandmother    Breast cancer Maternal Grandmother    Cancer - Other Maternal Grandmother        Lip cancer  COPD Maternal Grandfather    Bladder Cancer Maternal Grandfather    Heart disease Maternal Grandfather    Kidney disease Maternal Grandfather    Congestive Heart Failure Maternal Grandfather    Arrhythmia Maternal Grandfather        AFib   Other Father        Bleeding ulcers    Social History Social History   Tobacco Use   Smoking status: Every Day    Packs/day: .5    Types: Cigarettes   Smokeless tobacco: Never  Substance Use Topics   Alcohol use: No    Alcohol/week: 0.0 standard drinks of alcohol    Comment: social   Drug use: No     Allergies   Codeine, Hydrocodone, Lactose intolerance (gi), and Tomato   Review of Systems Review of Systems Per HPI  Physical Exam Triage Vital Signs ED Triage Vitals [09/18/22 1343]  Enc Vitals Group     BP (!) 144/90     Pulse Rate 79     Resp 16      Temp 97.9 F (36.6 C)     Temp Source Oral     SpO2 97 %     Weight      Height      Head Circumference      Peak Flow      Pain Score 10     Pain Loc      Pain Edu?      Excl. in [place]?    No data found.  Updated Vital Signs BP (!) 144/90 (BP Location: Left Arm)   Pulse 79   Temp 97.9 F (36.6 C) (Oral)   Resp 16   SpO2 97%   Visual Acuity Right Eye Distance:   Left Eye Distance:   Bilateral Distance:    Right Eye Near:   Left Eye Near:    Bilateral Near:     Physical Exam Constitutional:      General: He is not in acute distress.    Appearance: Normal appearance. He is not toxic-appearing or diaphoretic.  HENT:     Head: Normocephalic and atraumatic.     Mouth/Throat:     Lips: Pink.     Mouth: Mucous membranes are moist.     Dentition: Abnormal dentition. Dental tenderness and gingival swelling present.      Comments: Patient has gingival swelling and erythema present to left upper back dentition.  Multiple different dental abnormalities and cracked tooth. Eyes:     Extraocular Movements: Extraocular movements intact.     Conjunctiva/sclera: Conjunctivae normal.  Pulmonary:     Effort: Pulmonary effort is normal.  Neurological:     General: No focal deficit present.     Mental Status: He is alert and oriented to person, place, and time. Mental status is at baseline.  Psychiatric:        Mood and Affect: Mood normal.        Behavior: Behavior normal.        Thought Content: Thought content normal.        Judgment: Judgment normal.      UC Treatments / Results  Labs (all labs ordered are listed, but only abnormal results are displayed) Labs Reviewed - No data to display  EKG   Radiology No results found.  Procedures Procedures (including critical care time)  Medications Ordered in UC Medications - No data to display  Initial Impression / Assessment and Plan / UC Course  I have reviewed  the triage vital signs and the nursing  notes.  Pertinent labs & imaging results that were available during my care of the patient were reviewed by me and considered in my medical decision making (see chart for details).     Physical exam is consistent with dental infection.  Will treat with Augmentin as patient has taken before and tolerated well.  Patient already has oxycodone prescribed chronically so encouraged patient to use this as needed.  Advised warm compresses to outside of the face as well.  Advised following up with dentist at provided contact information and dental resources paperwork provided to patient as he does not have dental insurance.  Discussed return precautions.  Patient verbalized understanding and was agreeable with plan. Final Clinical Impressions(s) / UC Diagnoses   Final diagnoses:  Pain, dental  Dental infection     Discharge Instructions      Antibiotic has been prescribed for dental infection.    ED Prescriptions     Medication Sig Dispense Auth. Provider   amoxicillin-clavulanate (AUGMENTIN) 875-125 MG tablet Take 1 tablet by mouth every 12 (twelve) hours. 14 tablet [place], [place] [address], [place]      I have reviewed the PDMP during this encounter.   Teodora Medici, [place] 09/18/22 1359

## 2024-04-07 ENCOUNTER — Ambulatory Visit (HOSPITAL_COMMUNITY)
Admission: EM | Admit: 2024-04-07 | Discharge: 2024-04-07 | Disposition: A | Discharge status: Home / Self Care | Attending: Family Medicine | Admitting: Family Medicine

## 2024-04-07 ENCOUNTER — Other Ambulatory Visit: Payer: Self-pay

## 2024-04-07 ENCOUNTER — Encounter (HOSPITAL_COMMUNITY): Payer: Self-pay | Admitting: Emergency Medicine

## 2024-04-07 DIAGNOSIS — K029 Dental caries, unspecified: Secondary | ICD-10-CM

## 2024-04-07 DIAGNOSIS — K047 Periapical abscess without sinus: Secondary | ICD-10-CM

## 2024-04-07 MED ORDER — AMOXICILLIN-POT CLAVULANATE 875-125 MG PO TABS: 1.0000 | ORAL_TABLET | Freq: Two times a day (BID) | ORAL | 0 refills | Status: AC
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
## 2024-04-07 NOTE — ED Triage Notes (Signed)
 Dental pain started Tuesday.  Patient has swelling to left side of face.  Patient reports there are 2 broken teeth to upper left mouth.  Has taken ibuprofen and regular  pain medicines.  Has used canker pens from [facility] to numb the area

## 2024-04-07 NOTE — Discharge Instructions (Addendum)
-   Take 1 tablet of Augmentin  twice a day for 7 days - Please refer to the handout provided for dental resources, we recommend you follow-up with dentist soon as possible - If you develop worsening swelling, fevers, inability to swallow or breathe please go to the emergency department immediately

## 2024-07-07 ENCOUNTER — Ambulatory Visit: Payer: Self-pay
